# Patient Record
Sex: Male | Born: 1975 | Race: White | Hispanic: No | Marital: Married | Smoking: Former smoker
Health system: Southern US, Community
[De-identification: ages and names within clinical notes are randomized; demographics above are authoritative.]

## PROBLEM LIST (undated history)

## (undated) ENCOUNTER — Emergency Department: Admission: EM | Discharge status: Absent without leave | Payer: 59 | Source: Home / Self Care

## (undated) DIAGNOSIS — F1010 Alcohol abuse, uncomplicated: Secondary | ICD-10-CM

## (undated) DIAGNOSIS — IMO0001 Reserved for inherently not codable concepts without codable children: Secondary | ICD-10-CM

## (undated) DIAGNOSIS — I10 Essential (primary) hypertension: Secondary | ICD-10-CM

## (undated) DIAGNOSIS — R748 Abnormal levels of other serum enzymes: Secondary | ICD-10-CM

## (undated) HISTORY — DX: Reserved for inherently not codable concepts without codable children: IMO0001

## (undated) HISTORY — DX: Abnormal levels of other serum enzymes: R74.8

## (undated) HISTORY — DX: Alcohol abuse, uncomplicated: F10.10

## (undated) HISTORY — DX: Essential (primary) hypertension: I10

---

## 2009-01-05 ENCOUNTER — Ambulatory Visit: Payer: Self-pay | Admitting: Occupational Medicine

## 2009-01-09 ENCOUNTER — Ambulatory Visit: Payer: Self-pay | Admitting: Family Medicine

## 2009-01-09 DIAGNOSIS — E785 Hyperlipidemia, unspecified: Secondary | ICD-10-CM | POA: Insufficient documentation

## 2009-01-09 DIAGNOSIS — I10 Essential (primary) hypertension: Secondary | ICD-10-CM | POA: Insufficient documentation

## 2009-01-09 DIAGNOSIS — F1010 Alcohol abuse, uncomplicated: Secondary | ICD-10-CM | POA: Insufficient documentation

## 2009-04-09 ENCOUNTER — Ambulatory Visit: Payer: Self-pay | Admitting: Family Medicine

## 2009-05-06 ENCOUNTER — Encounter: Payer: Self-pay | Admitting: Family Medicine

## 2009-05-07 LAB — CONVERTED CEMR LAB
BUN: 16 mg/dL (ref 6–23)
CO2: 23 meq/L (ref 19–32)
Calcium: 10.2 mg/dL (ref 8.4–10.5)
Chloride: 106 meq/L (ref 96–112)
Creatinine, Ser: 1.04 mg/dL (ref 0.40–1.50)
Glucose, Bld: 101 mg/dL — ABNORMAL HIGH (ref 70–99)
Potassium: 4.7 meq/L (ref 3.5–5.3)
Sodium: 140 meq/L (ref 135–145)

## 2009-05-15 ENCOUNTER — Encounter: Payer: Self-pay | Admitting: Family Medicine

## 2009-05-20 ENCOUNTER — Telehealth: Payer: Self-pay | Admitting: Family Medicine

## 2009-12-25 ENCOUNTER — Ambulatory Visit: Payer: Self-pay | Admitting: Family Medicine

## 2009-12-25 DIAGNOSIS — R0789 Other chest pain: Secondary | ICD-10-CM | POA: Insufficient documentation

## 2010-01-04 ENCOUNTER — Encounter: Payer: Self-pay | Admitting: Family Medicine

## 2010-01-05 ENCOUNTER — Encounter: Payer: Self-pay | Admitting: Family Medicine

## 2010-01-05 LAB — CONVERTED CEMR LAB
ALT: 69 units/L — ABNORMAL HIGH (ref 0–53)
AST: 48 units/L — ABNORMAL HIGH (ref 0–37)
Albumin: 4.4 g/dL (ref 3.5–5.2)
Alkaline Phosphatase: 46 units/L (ref 39–117)
BUN: 17 mg/dL (ref 6–23)
Basophils Absolute: 0 10*3/uL (ref 0.0–0.1)
Basophils Relative: 1 % (ref 0–1)
CO2: 22 meq/L (ref 19–32)
Calcium: 9.3 mg/dL (ref 8.4–10.5)
Chloride: 106 meq/L (ref 96–112)
Cholesterol: 227 mg/dL — ABNORMAL HIGH (ref 0–200)
Creatinine, Ser: 1.18 mg/dL (ref 0.40–1.50)
Eosinophils Absolute: 0.2 10*3/uL (ref 0.0–0.7)
Eosinophils Relative: 5 % (ref 0–5)
Glucose, Bld: 98 mg/dL (ref 70–99)
HCT: 43.5 % (ref 39.0–52.0)
HDL: 35 mg/dL — ABNORMAL LOW (ref >39)
Hemoglobin: 14.6 g/dL (ref 13.0–17.0)
LDL Cholesterol: 114 mg/dL — ABNORMAL HIGH (ref 0–99)
Lymphocytes Relative: 29 % (ref 12–46)
Lymphs Abs: 1 10*3/uL (ref 0.7–4.0)
MCHC: 33.6 g/dL (ref 30.0–36.0)
MCV: 89.7 fL (ref 78.0–100.0)
Monocytes Absolute: 0.3 10*3/uL (ref 0.1–1.0)
Monocytes Relative: 8 % (ref 3–12)
Neutro Abs: 2.1 10*3/uL (ref 1.7–7.7)
Neutrophils Relative %: 58 % (ref 43–77)
Platelets: 163 10*3/uL (ref 150–400)
Potassium: 4.6 meq/L (ref 3.5–5.3)
RBC: 4.85 M/uL (ref 4.22–5.81)
RDW: 12.8 % (ref 11.5–15.5)
Sodium: 139 meq/L (ref 135–145)
TSH: 1.264 microintl units/mL (ref 0.350–4.500)
Total Bilirubin: 0.6 mg/dL (ref 0.3–1.2)
Total CHOL/HDL Ratio: 6.5
Total Protein: 7.1 g/dL (ref 6.0–8.3)
Triglycerides: 391 mg/dL — ABNORMAL HIGH (ref <150)
VLDL: 78 mg/dL — ABNORMAL HIGH (ref 0–40)
WBC: 3.5 10*3/uL — ABNORMAL LOW (ref 4.0–10.5)

## 2010-01-06 DIAGNOSIS — R740 Nonspecific elevation of levels of transaminase and lactic acid dehydrogenase [LDH]: Secondary | ICD-10-CM

## 2010-01-06 DIAGNOSIS — R7401 Elevation of levels of liver transaminase levels: Secondary | ICD-10-CM | POA: Insufficient documentation

## 2010-01-06 DIAGNOSIS — R7402 Elevation of levels of lactic acid dehydrogenase (LDH): Secondary | ICD-10-CM | POA: Insufficient documentation

## 2010-01-06 LAB — CONVERTED CEMR LAB
HCV Ab: NEGATIVE
Hep A IgM: NEGATIVE
Hep B C IgM: NEGATIVE
Hepatitis B Surface Ag: NEGATIVE

## 2010-01-22 ENCOUNTER — Encounter: Payer: Self-pay | Admitting: Family Medicine

## 2010-01-25 LAB — CONVERTED CEMR LAB
ALT: 90 units/L — ABNORMAL HIGH (ref 0–53)
AST: 52 units/L — ABNORMAL HIGH (ref 0–37)
Albumin: 4.9 g/dL (ref 3.5–5.2)
Alkaline Phosphatase: 54 units/L (ref 39–117)
Bilirubin, Direct: 0.1 mg/dL (ref 0.0–0.3)
Indirect Bilirubin: 0.5 mg/dL (ref 0.0–0.9)
Total Bilirubin: 0.6 mg/dL (ref 0.3–1.2)
Total Protein: 7.8 g/dL (ref 6.0–8.3)

## 2010-01-26 ENCOUNTER — Encounter: Admission: RE | Admit: 2010-01-26 | Discharge: 2010-01-26 | Payer: Self-pay | Admitting: Family Medicine

## 2010-01-26 ENCOUNTER — Encounter: Payer: Self-pay | Admitting: Family Medicine

## 2010-04-02 ENCOUNTER — Ambulatory Visit: Payer: Self-pay | Admitting: Family Medicine

## 2010-04-02 DIAGNOSIS — I10 Essential (primary) hypertension: Secondary | ICD-10-CM | POA: Insufficient documentation

## 2010-04-03 ENCOUNTER — Encounter: Payer: Self-pay | Admitting: Family Medicine

## 2010-04-03 LAB — CONVERTED CEMR LAB
ALT: 67 units/L — ABNORMAL HIGH (ref 0–53)
AST: 48 units/L — ABNORMAL HIGH (ref 0–37)
Albumin: 4.6 g/dL (ref 3.5–5.2)
Alkaline Phosphatase: 48 units/L (ref 39–117)
Bilirubin, Direct: 0.1 mg/dL (ref 0.0–0.3)
Indirect Bilirubin: 0.5 mg/dL (ref 0.0–0.9)
Total Bilirubin: 0.6 mg/dL (ref 0.3–1.2)
Total Protein: 7.3 g/dL (ref 6.0–8.3)

## 2010-04-07 ENCOUNTER — Ambulatory Visit: Payer: Self-pay | Admitting: Family Medicine

## 2010-04-07 DIAGNOSIS — R1013 Epigastric pain: Secondary | ICD-10-CM | POA: Insufficient documentation

## 2010-04-08 ENCOUNTER — Encounter (INDEPENDENT_AMBULATORY_CARE_PROVIDER_SITE_OTHER): Payer: Self-pay | Admitting: *Deleted

## 2010-04-08 LAB — CONVERTED CEMR LAB
ALT: 66 units/L — ABNORMAL HIGH (ref 0–53)
AST: 44 units/L — ABNORMAL HIGH (ref 0–37)
Albumin: 4.6 g/dL (ref 3.5–5.2)
Alkaline Phosphatase: 50 units/L (ref 39–117)
BUN: 12 mg/dL (ref 6–23)
Basophils Absolute: 0 10*3/uL (ref 0.0–0.1)
Basophils Relative: 1 % (ref 0–1)
CO2: 23 meq/L (ref 19–32)
Calcium: 9.7 mg/dL (ref 8.4–10.5)
Chloride: 104 meq/L (ref 96–112)
Creatinine, Ser: 1.13 mg/dL (ref 0.40–1.50)
Eosinophils Absolute: 0.2 10*3/uL (ref 0.0–0.7)
Eosinophils Relative: 4 % (ref 0–5)
GGT: 115 units/L — ABNORMAL HIGH (ref 7–51)
Glucose, Bld: 85 mg/dL (ref 70–99)
HCT: 42.1 % (ref 39.0–52.0)
Hemoglobin: 14.2 g/dL (ref 13.0–17.0)
Lymphocytes Relative: 25 % (ref 12–46)
Lymphs Abs: 1 10*3/uL (ref 0.7–4.0)
MCHC: 33.7 g/dL (ref 30.0–36.0)
MCV: 88.8 fL (ref 78.0–100.0)
Monocytes Absolute: 0.3 10*3/uL (ref 0.1–1.0)
Monocytes Relative: 8 % (ref 3–12)
Neutro Abs: 2.6 10*3/uL (ref 1.7–7.7)
Neutrophils Relative %: 63 % (ref 43–77)
Platelets: 181 10*3/uL (ref 150–400)
Potassium: 4.1 meq/L (ref 3.5–5.3)
RBC: 4.74 M/uL (ref 4.22–5.81)
RDW: 13 % (ref 11.5–15.5)
Sodium: 140 meq/L (ref 135–145)
Total Bilirubin: 0.6 mg/dL (ref 0.3–1.2)
Total Protein: 7.5 g/dL (ref 6.0–8.3)
WBC: 4.2 10*3/uL (ref 4.0–10.5)

## 2010-04-13 ENCOUNTER — Encounter: Payer: Self-pay | Admitting: Family Medicine

## 2010-04-15 ENCOUNTER — Encounter: Payer: Self-pay | Admitting: Family Medicine

## 2010-08-02 ENCOUNTER — Ambulatory Visit: Payer: Self-pay | Admitting: Family Medicine

## 2010-08-03 LAB — CONVERTED CEMR LAB
ALT: 117 units/L — ABNORMAL HIGH (ref 0–53)
AST: 78 units/L — ABNORMAL HIGH (ref 0–37)
Albumin: 4.8 g/dL (ref 3.5–5.2)
Alkaline Phosphatase: 48 units/L (ref 39–117)
BUN: 15 mg/dL (ref 6–23)
CO2: 25 meq/L (ref 19–32)
Calcium: 9.8 mg/dL (ref 8.4–10.5)
Chloride: 103 meq/L (ref 96–112)
Creatinine, Ser: 1.08 mg/dL (ref 0.40–1.50)
Glucose, Bld: 80 mg/dL (ref 70–99)
HCT: 42.7 % (ref 39.0–52.0)
Hemoglobin: 14.7 g/dL (ref 13.0–17.0)
MCHC: 34.4 g/dL (ref 30.0–36.0)
MCV: 86.4 fL (ref 78.0–100.0)
Platelets: 169 10*3/uL (ref 150–400)
Potassium: 4.5 meq/L (ref 3.5–5.3)
RBC: 4.94 M/uL (ref 4.22–5.81)
RDW: 12.8 % (ref 11.5–15.5)
Sodium: 140 meq/L (ref 135–145)
TSH: 1.103 microintl units/mL (ref 0.350–4.500)
Total Bilirubin: 0.7 mg/dL (ref 0.3–1.2)
Total Protein: 7.5 g/dL (ref 6.0–8.3)
WBC: 5.2 10*3/uL (ref 4.0–10.5)

## 2010-09-09 ENCOUNTER — Encounter: Payer: Self-pay | Admitting: Family Medicine

## 2010-11-12 ENCOUNTER — Ambulatory Visit
Admission: RE | Admit: 2010-11-12 | Discharge: 2010-11-12 | Payer: Self-pay | Source: Home / Self Care | Attending: Family Medicine | Admitting: Family Medicine

## 2010-11-12 ENCOUNTER — Encounter: Payer: Self-pay | Admitting: Family Medicine

## 2010-11-12 DIAGNOSIS — R51 Headache: Secondary | ICD-10-CM | POA: Insufficient documentation

## 2010-11-12 DIAGNOSIS — R519 Headache, unspecified: Secondary | ICD-10-CM | POA: Insufficient documentation

## 2010-11-15 LAB — CONVERTED CEMR LAB
ALT: 136 units/L — ABNORMAL HIGH (ref 0–53)
AST: 73 units/L — ABNORMAL HIGH (ref 0–37)
Albumin: 5.4 g/dL — ABNORMAL HIGH (ref 3.5–5.2)
Alkaline Phosphatase: 50 units/L (ref 39–117)
BUN: 15 mg/dL (ref 6–23)
Basophils Absolute: 0 10*3/uL (ref 0.0–0.1)
Basophils Relative: 1 % (ref 0–1)
CO2: 24 meq/L (ref 19–32)
Calcium: 9.9 mg/dL (ref 8.4–10.5)
Chloride: 104 meq/L (ref 96–112)
Creatinine, Ser: 1.16 mg/dL (ref 0.40–1.50)
Eosinophils Absolute: 0.1 10*3/uL (ref 0.0–0.7)
Eosinophils Relative: 2 % (ref 0–5)
Glucose, Bld: 93 mg/dL (ref 70–99)
HCT: 42.3 % (ref 39.0–52.0)
Hemoglobin: 14.8 g/dL (ref 13.0–17.0)
Lymphocytes Relative: 26 % (ref 12–46)
Lymphs Abs: 1.1 10*3/uL (ref 0.7–4.0)
MCHC: 35 g/dL (ref 30.0–36.0)
MCV: 86.9 fL (ref 78.0–100.0)
Monocytes Absolute: 0.3 10*3/uL (ref 0.1–1.0)
Monocytes Relative: 8 % (ref 3–12)
Neutro Abs: 2.6 10*3/uL (ref 1.7–7.7)
Neutrophils Relative %: 64 % (ref 43–77)
Platelets: 167 10*3/uL (ref 150–400)
Potassium: 4 meq/L (ref 3.5–5.3)
RBC: 4.87 M/uL (ref 4.22–5.81)
RDW: 12.3 % (ref 11.5–15.5)
Sodium: 140 meq/L (ref 135–145)
Total Bilirubin: 0.6 mg/dL (ref 0.3–1.2)
Total Protein: 7.8 g/dL (ref 6.0–8.3)
WBC: 4.1 10*3/uL (ref 4.0–10.5)

## 2010-11-30 NOTE — Consult Note (Signed)
Summary: [facility]  [facility]   Imported By: Lanelle Bal 09/21/2010 13:02:23  _____________________________________________________________________  External Attachment:    Type:   Image     Comment:   External Document
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
## 2010-11-30 NOTE — Assessment & Plan Note (Signed)
Summary: Fu HTN, cough on ACEi   Vital Signs:  Patient profile:   35 year old male Height:      75.25 inches Weight:      300 pounds Pulse rate:   81 / minute BP sitting:   144 / 91  (left arm) Cuff size:   large  Vitals Entered By: Kathlene November (April 09, 2009 8:51 AM) CC: recheck on BP, Hypertension Management   Primary Care Provider:  Nani Gasser MD  CC:  recheck on BP and Hypertension Management.  History of Present Illness: Has lost 14 pounds and BP looks much better. Has a dry cough.  Somtimes int he middle of the night.  Thinks it is from the medicine.  Has had it for about 3 weeks.  Had URI initally but otherwise all signs have resolved.   Hypertension History:      He denies headache, chest pain, palpitations, dyspnea with exertion, orthopnea, PND, peripheral edema, visual symptoms, neurologic problems, syncope, and side effects from treatment.  He notes the following problems with antihypertensive medication side effects: Did feel a little light headed the first couple of days but that has resolved.  Marland Kitchen        Positive major cardiovascular risk factors include hyperlipidemia, hypertension, and current tobacco user.  Negative major cardiovascular risk factors include male age less than 87 years old.     Current Medications (verified): 1)  Lisinopril-Hydrochlorothiazide 10-12.5 Mg Tabs (Lisinopril-Hydrochlorothiazide) .... Take 1 Tablet By Mouth Once A Day  Allergies (verified): 1)  Lisinopril-Hydrochlorothiazide (Lisinopril-Hydrochlorothiazide)  Comments:  Nurse/Medical Assistant: The patient's medications and allergies were reviewed with the patient and were updated in the Medication and Allergy Lists. Kathlene November (April 09, 2009 8:52 AM)  Physical Exam  General:  Well-developed,well-nourished,in no acute distress; alert,appropriate and cooperative throughout examination Head:  Normocephalic and atraumatic without obvious abnormalities. No apparent alopecia or  balding. Lungs:  Normal respiratory effort, chest expands symmetrically. Lungs are clear to auscultation, no crackles or wheezes. Heart:  Normal rate and regular rhythm. S1 and S2 normal without gallop, murmur, click, rub or other extra sounds. Skin:  no rashes.   Psych:  Cognition and judgment appear intact. Alert and cooperative with normal attention span and concentration. No apparent delusions, illusions, hallucinations   Impression & Recommendations:  Problem # 1:  ESSENTIAL HYPERTENSION, BENIGN (ICD-401.1) IMproved but having cough on an ACE. Will change to ARB. It appears Benicar is preferred on his plan. New rx given and coupon card given.  His updated medication list for this problem includes:    Lisinopril-hydrochlorothiazide 10-12.5 Mg Tabs (Lisinopril-hydrochlorothiazide) .Marland Kitchen... Take 1 tablet by mouth once a day    Benicar Hct 40-12.5 Mg Tabs (Olmesartan medoxomil-hctz) .Marland Kitchen... Take 1 tablet by mouth once a day  Orders: T-Basic Metabolic Panel [id number])  BP today: 144/91 Prior BP: 154/95 (01/09/2009)  Complete Medication List: 1)  Lisinopril-hydrochlorothiazide 10-12.5 Mg Tabs (Lisinopril-hydrochlorothiazide) .... Take 1 tablet by mouth once a day 2)  Benicar Hct 40-12.5 Mg Tabs (Olmesartan medoxomil-hctz) .... Take 1 tablet by mouth once a day  Hypertension Assessment/Plan:      The patient's hypertensive risk group is category B: At least one risk factor (excluding diabetes) with no target organ damage.  Today's blood pressure is 144/91.  His blood pressure goal is < 140/90. Prescriptions: BENICAR HCT 40-12.5 MG TABS (OLMESARTAN MEDOXOMIL-HCTZ) Take 1 tablet by mouth once a day  #30 x 2   Entered and Authorized by:   Nani Gasser MD  Signed by:   Nani Gasser MD on 04/09/2009   Method used:   Electronically to        [facility] [phone]* (retail)       [address] [place], [place]  [address]       Ph: [phone] or [phone]       Fax:  [phone]   RxID:   [id number]

## 2010-11-30 NOTE — Letter (Signed)
Summary: Primary Care Consult Scheduled Letter  [facility]  [address], [address]   [place], [place] [address]   Phone: [phone]  Fax: [phone]      04/08/2010 MRN: [id number]  RANSOME HELWIG [address] [place], [place]  [address]    Dear Mr. Paulla Dolly,    We have scheduled an appointment for you.  At the recommendation of Dr.Bowen, we have scheduled you a consult with [facility] [place]- Dr.Katopes on 04/19/10 at 3:15.  Their address is [address] [place], [address]. The office phone number is [phone].  If this appointment day and time is not convenient for you, please feel free to call the office of the doctor you are being referred to at the number listed above and reschedule the appointment.     It is important for you to keep your scheduled appointments. We are here to make sure you are given good patient care.     Thank you, Michaelle Copas Patient Care Coordinator [facility] [place] [phone]

## 2010-11-30 NOTE — Assessment & Plan Note (Signed)
Summary: epigastric pain   Vital Signs:  Patient profile:   35 year old male Height:      75.25 inches Weight:      308 pounds BMI:     38.38 O2 Sat:      98 % on Room air Temp:     98.6 degrees F oral Pulse rate:   70 / minute BP sitting:   122 / 77  (left arm) Cuff size:   large  Vitals Entered By: Payton Spark CMA (April 07, 2010 1:55 PM)  O2 Flow:  Room air CC: Upper GI discomfort x 2 weeks.    Primary Care Provider:  Nani Gasser MD  CC:  Upper GI discomfort x 2 weeks. Marland Kitchen  History of Present Illness: 36 yo WM presents for epigastric pain x 2 wks.  He went to [facility] and was given Prilosec which helped a little bit.  He has had some lightheadedness and presyncope.  Denies chest pain, cough, SOB or heartburn.  No blood in the stools.  No melena.  No constipation or diarrhea.    This all started 2 wks ago.  He had CP 2 mos ago and had a normal EKG.  This pain comes and goes.  It doesn't seem to be related to food.  Has a good appetite.  He is not taking any ASA or NSAIDs.  He does drink 3 + liquor drinks/ day.  This does not seem to cause pain.    Current Medications (verified): 1)  Hyzaar 100-12.5 Mg Tabs (Losartan Potassium-Hctz) .... Take 1 Tablet By Mouth Once A Day 2)  Omeprazole 40 Mg Cpdr (Omeprazole) .... One By Mouth Qam 30 Min Ac  Allergies (verified): 1)  Lisinopril-Hydrochlorothiazide (Lisinopril-Hydrochlorothiazide)  Past History:  Past Medical History: Hypertension elevated liver enzymes heavy ETOH use  Social History: Reviewed history from 01/09/2009 and no changes required. 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   Eyes:  sclera non icteric Mouth:  good dentition and pharynx pink and moist.   Neck:  no masses.   Lungs:  Normal respiratory effort, chest expands symmetrically.  Lungs are clear to auscultation, no crackles or wheezes. Heart:  Normal rate and regular rhythm. S1 and S2 normal without gallop, murmur, click, rub or other extra sounds. Abdomen:  soft, non-tender, normal bowel sounds, no distention, no masses, no guarding, no hepatomegaly, and no splenomegaly.   Extremities:  no LE edema Skin:  color normal.  no jaundice or pallor Psych:  good eye contact, not anxious appearing, and not depressed appearing.     Impression & Recommendations:  Problem # 1:  EPIGASTRIC PAIN (ICD-789.06) Likely to be reflux esophagitis with a hiatal hernia.  Will get a CBC today given symptoms of lightheadedness and hx of heavy ETOH use to be sure he does not have GI bleeding/ ulcers.    Will change Omeprazole to Dexilant, samples given.  REflux precautions given.  Avoid ASA or NSAIDs.  Avoid ETOH and smoking.  Refer to GI for further eval.   Orders: T-Comprehensive Metabolic Panel ([id number]) T-CBC w/Diff ([id number]) Gastroenterology Referral (GI)  Complete Medication List: 1)  Hyzaar 100-12.5 Mg Tabs (Losartan potassium-hctz) .... Take 1 tablet by mouth once a day 2)  Dexilant 60 Mg Cpdr (Dexlansoprazole) .Marland KitchenMarland KitchenMarland Kitchen 1  capsule by mouth daily  Other Orders: T-Gamma GT (GGT) ([id number])  Patient Instructions: 1)  Check labs downstairs today. 2)  Will call you w/ results tomorrow. 3)  Change Omeprazole to Dexilant, 1 capsule by mouth daily. 4)  Adhere to reflux precautions. 5)  Avoid smoking and cut back on alcohol. 6)  Avoid any aspirin or ibuprofen products. 7)  Will set you up to see GI to look for hiatal hernia, reflux esophagitis or gastritis.

## 2010-11-30 NOTE — Letter (Signed)
Summary: Letter to Patient with Lab Results/[facility]   Letter to Patient with Lab Results/[facility]   Imported By: Lanelle Bal 04/22/2010 14:25:23  _____________________________________________________________________  External Attachment:    Type:   Image     Comment:   External Document

## 2010-11-30 NOTE — Letter (Signed)
Summary: Internal Correspondence-RELEASE FORM  Internal Correspondence-RELEASE FORM   Imported By: Vanessa Swaziland 01/26/2010 14:45:16  _____________________________________________________________________  External Attachment:    Type:   Image     Comment:   INTERNAL Document

## 2010-11-30 NOTE — Assessment & Plan Note (Signed)
Summary: NEW PT: HTN, cholesterol, etc   Vital Signs:  Patient profile:   35 year old male Height:      75.25 inches Weight:      314 pounds BMI:     39.13 O2 Sat:      98 % Pulse rate:   69 / minute BP sitting:   154 / 95  (left arm) Cuff size:   large  Vitals Entered By: Harlene Salts (January 09, 2009 9:52 AM) Is Patient Diabetic? No Pain Assessment Patient in pain? no        History of Present Illness: NOV,ELEVATED BP.  Dx in Urgent Care.  Mom with HTN.  Notices frequent sweating. No HAs, lightheadedness or dizziness.  Has gained about 50 lbs in the lst 10 years.  Drinking to much.  Drinks about 3-4 liquid a day.   Enjoys alchohol.  Smokes a couple of cig a week.    Habits & Providers     Alcohol drinks/day: 2     Alcohol Counseling: to decrease amount and/or frequency of alcohol intake     Alcohol type: liquor     >5/day in last 3 mos: yes     Feels need to cut down: yes     Feels guilty re: drinking: yes     Tobacco Status: current     Tobacco Counseling: to quit use of tobacco products     Cigarette Packs/Day: <0.25     Year Started: 1994     Diet Comments: fair     Does Patient Exercise: no     Drug Use: current     Seat Belt Use: always  Current Medications (verified): 1)  Ibu 800 Mg Tabs (Ibuprofen) .... 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  No vison changes.  No difficulty hearing/ringing in ears, hay fever/allergies.  No chest pain/discomfort, palpitations.  No Br lump/nipple discharge.  No cough/wheeze.  No blood in BM, nausea/vomiting/diarrhea.  No nighttime urination, leaking urine, unusual vaginal bleeding, discharge (penis or vagina).  No muscle/joint pain. No rash, change in mole.  No HA, memory loss.  No anxiety, sleep d/o, depression.  No easy bruising/bleeding, unexplained lump   Physical Exam  General:  Well-developed,well-nourished,in no acute distress; alert,appropriate and cooperative throughout examination Head:  Normocephalic and atraumatic without obvious abnormalities. No apparent alopecia or balding. Lungs:  Normal respiratory effort, chest expands symmetrically. Lungs are clear to auscultation, no crackles or wheezes. Heart:  Normal rate and regular rhythm. S1 and S2 normal without gallop, murmur, click, rub or other extra sounds. Pulses:  Raidla  2+  Skin:  no rashes.   Psych:  Cognition and judgment appear intact. Alert and cooperative with normal attention span and concentration. No apparent delusions, illusions, hallucinations   Impression & Recommendations:  Problem # 1:  ESSENTIAL HYPERTENSION, BENIGN (ICD-401.1) Assessment New Warned of ptential se. Discusse dthe DASH diet. FU in 3 week.  His updated medication list for this problem includes:    Lisinopril-hydrochlorothiazide 10-12.5 Mg Tabs (Lisinopril-hydrochlorothiazide) .Marland Kitchen... Take 1 tablet by mouth once a day  BP today: 154/95 Prior BP: 161/104 (01/05/2009)  Problem # 2:  HYPERLIPIDEMIA (ICD-272.4) Pt will bring in copy from [facility]. He says his numbers were high.   Problem # 3:  ALCOHOL ABUSE (ICD-305.00) Pt wants to discuss further at the next visit.   Complete Medication List: 1)  Ibu 800 Mg Tabs (Ibuprofen) .... One tab three times a day with food 2)  Lisinopril-hydrochlorothiazide  10-12.5 Mg Tabs (Lisinopril-hydrochlorothiazide) .... Take 1 tablet by mouth once a day  Patient Instructions: 1)  DASH diet ( .nih/gov) 2)  Please schedule a follow-up appointment in 3-4 weeks for fu the blood pressure.  Prescriptions: LISINOPRIL-HYDROCHLOROTHIAZIDE 10-12.5 MG TABS (LISINOPRIL-HYDROCHLOROTHIAZIDE) Take 1 tablet by mouth once a day  #30 x 1   Entered and Authorized by:   Nani Gasser MD   Signed by:   Nani Gasser MD on 01/09/2009   Method used:   [facility] [phone][facility] (retail)       [address]       [place], [place]  [address]       Ph: [phone] or [phone]       Fax: [phone]   RxID:   [id number]

## 2010-11-30 NOTE — Assessment & Plan Note (Signed)
Summary: UNCOMFORTABLE IN UPPER STOMACH   Vital Signs:  Patient Profile:   35 Years Old Male CC:      Epigastric discomfort x 2 weeks Height:     75.25 inches Weight:      295 pounds O2 Sat:      98 % O2 treatment:    Room Air Temp:     99.2 degrees F oral Pulse rate:   66 / minute Pulse rhythm:   regular Resp:     16 per minute BP sitting:   135 / 87  (right arm) Cuff size:   large  Vitals Entered By: Emilio Math (April 02, 2010 1:54 PM)                  Current Allergies (reviewed today): LISINOPRIL-HYDROCHLOROTHIAZIDE (LISINOPRIL-HYDROCHLOROTHIAZIDE)History of Present Illness Chief Complaint: Epigastric discomfort x 2 weeks History of Present Illness: Subjective:  Patient complains of 2 week history of vague intermittent painless epigastric "fluttering" sensation that may last 10 seconds to a minute, and occurs several times daily.  He points to his sub-xiphoid area.  The sensation does not seem to be related to eating, and tends to occur in the morning.  No chest pain or shortness of breath.  No cough.  He states that EKG and GB U/S were done one month ago and were negative.  His LFT's were mildly elevated.  No fevers, chills, and sweats.  No nausea/vomiting.  BM's have had no changes.  He states that he has had indigestion in the past but not recently.  Current Meds HYZAAR 100-12.5 MG TABS (LOSARTAN POTASSIUM-HCTZ) Take 1 tablet by mouth once a day OMEPRAZOLE 40 MG CPDR (OMEPRAZOLE) One by mouth Qam 30 min AC  REVIEW OF SYSTEMS Constitutional Symptoms      Denies fever, chills, night sweats, weight loss, weight gain, and fatigue.  Eyes       Denies change in vision, eye pain, eye discharge, glasses, contact lenses, and eye surgery. Ear/Nose/Throat/Mouth       Denies hearing loss/aids, change in hearing, ear pain, ear discharge, dizziness, frequent runny nose, frequent nose bleeds, sinus problems, sore throat, hoarseness, and tooth pain or bleeding.  Respiratory  Denies dry cough, productive cough, wheezing, shortness of breath, asthma, bronchitis, and emphysema/COPD.  Cardiovascular       Denies murmurs, chest pain, and tires easily with exhertion.    Gastrointestinal       Complains of stomach pain.      Denies nausea/vomiting, diarrhea, constipation, blood in bowel movements, and indigestion. Genitourniary       Denies painful urination, kidney stones, and loss of urinary control. Neurological       Denies paralysis, seizures, and fainting/blackouts. Musculoskeletal       Denies muscle pain, joint pain, joint stiffness, decreased range of motion, redness, swelling, muscle weakness, and gout.  Skin       Denies bruising, unusual mles/lumps or sores, and hair/skin or nail changes.  Psych       Denies mood changes, temper/anger issues, anxiety/stress, speech problems, depression, and sleep problems.  Past History:  Past Medical History: Hypertension  Past Surgical History: Reviewed history from 01/05/2009 and no changes required. Denies surgical history  Family History: Reviewed history from 01/09/2009 and no changes required. Mother, HTN, hi cholesterol Father, Healthy Sister, Healthy  Social History: Reviewed history from 01/09/2009 and no changes required. 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  Extraocular movement is intact.  Conjunctivae are not inflamed.  Pharynx:  Normal  Neck:  Supple.  No adenopathy is present.  No thyromegaly is present  Lungs:  Clear to auscultation.  Breath sounds are equal.  Heart:  Regular rate and rhythm without murmurs, rubs, or gallops.  Abdomen:  Nontender without masses or hepatosplenomegaly.  Bowel sounds are present.  No CVA or flank tenderness.  There is no tenderness palpated in the sub-xiphoid  area. Extremities:  No edema.   Assessment  Assessed CHEST PAIN, ATYPICAL as deteriorated - Donna Christen MD New Problems: HYPERTENSION (ICD-401.9)  ? GERD  Plan New Medications/Changes: OMEPRAZOLE 40 MG CPDR (OMEPRAZOLE) One by mouth Qam 30 min AC  #15 x 1, 04/02/2010, Donna Christen MD  New Orders: T-Hepatic Function [id number] Est. Patient Level III [08657] Planning Comments:   Trial of omeprazole daily; if symptoms improve, continue for one month.   Repeat LFT's Follow-up with PCP   The patient and/or caregiver has been counseled thoroughly with regard to medications prescribed including dosage, schedule, interactions, rationale for use, and possible side effects and they verbalize understanding.  Diagnoses and expected course of recovery discussed and will return if not improved as expected or if the condition worsens. Patient and/or caregiver verbalized understanding.  Prescriptions: OMEPRAZOLE 40 MG CPDR (OMEPRAZOLE) One by mouth Qam 30 min AC  #15 x 1   Entered and Authorized by:   Donna Christen MD   Signed by:   Donna Christen MD on 04/02/2010   Method used:   Print then Give to Patient   RxID:   [id number]   Orders Added: 1)  T-Hepatic Function [[id number]] 2)  Est. Patient Level III [44034]

## 2010-11-30 NOTE — Consult Note (Signed)
Summary: [facility]  [facility]   Imported By: Lanelle Bal 04/20/2010 12:24:12  _____________________________________________________________________  External Attachment:    Type:   Image     Comment:   External Document

## 2010-11-30 NOTE — Assessment & Plan Note (Signed)
Summary: BACK PAIN/KH   Vital Signs:  Patient Profile:   35 Years Old Male CC:      Lower back pain x 3 days moving some large blocks on Thursday, pain started on Saturday Height:     75.5 inches Weight:      311 pounds O2 Sat:      97 % O2 treatment:    Room Air Temp:     98.3 degrees F oral Pulse rate:   76 / minute Pulse rhythm:   regular Resp:     16 per minute BP sitting:   161 / 104  (right arm) Cuff size:   large  Pt. in pain?   yes    Location:   lower back    Intensity:   6    Type:       sharp  Vitals Entered By: Emilio Math (January 05, 2009 2:42 PM)                   Current Allergies: No known allergies  History of Present Illness Chief Complaint: Lower back pain x 3 days moving some large blocks on Thursday, pain started on Saturday History of Present Illness: 35 yo male with low back pain since Saturday.  Pt states he hurt his lower back while moving some large retaining blocks last Thursday and pain statred worsening Saturday morning.  Pt states it happened before but this is worse.  Pt has an appt with the chiropracter later this afternoon.  Pt denies bowel or bladder changes or problems.  Denies loss of sensation in feet or legs.  Pt states the pain does not radiate but is localized in one area.  Pt does not have a pcm.  No other complaints at this time.     All:  nkda   REVIEW OF SYSTEMS Constitutional Symptoms      Denies fever, chills, night sweats, weight loss, weight gain, and fatigue.  Eyes       Denies change in vision, eye pain, eye discharge, glasses, contact lenses, and eye surgery. Ear/Nose/Throat/Mouth       Denies hearing loss/aids, change in hearing, ear pain, ear discharge, dizziness, frequent runny nose, frequent nose bleeds, sinus problems, sore throat, hoarseness, and tooth pain or bleeding.  Respiratory       Denies dry cough, productive cough, wheezing, shortness of breath, asthma, bronchitis, and emphysema/COPD.   Cardiovascular       Denies murmurs, chest pain, and tires easily with exhertion.    Gastrointestinal       Denies stomach pain, nausea/vomiting, diarrhea, constipation, blood in bowel movements, and indigestion. Genitourniary       Denies painful urination, kidney stones, and loss of urinary control. Neurological       Denies paralysis, seizures, and fainting/blackouts. Musculoskeletal       Complains of muscle pain and joint pain.      Denies joint stiffness, decreased range of motion, redness, swelling, muscle weakness, and gout.  Skin       Denies bruising, unusual mles/lumps or sores, and hair/skin or nail changes.  Psych       Denies mood changes, temper/anger issues, anxiety/stress, speech problems, depression, and sleep problems.  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 General appearance: well developed, well nourished, no acute distress Head: normocephalic, atraumatic Chest/Lungs: no rales, wheezes, or rhonchi bilateral, breath sounds equal without effort Heart: regular rate and  rhythm, no murmur Extremities: normal extremities Neurological: grossly intact and non-focal Back: tender musculature bilateral lower back, straight leg raises negative bilaterally, deep tendon reflexes 2+ at achilles and patella MSE: oriented to time, place, and person    Assessment New Problems: BACK PAIN (ICD-724.5)   Plan New Medications/Changes: IBU 800 MG TABS (IBUPROFEN) one tab three times a day with food  #30 x 0, 01/05/2009, Jodie Echevaria DO  New Orders: New Patient Level III [99203] Ketorolac-Toradol 15mg  [J1885] Admin of Therapeutic Inj  intramuscular or subcutaneous [96372] Planning Comments:   1.  Rest, apply ice to  area for 20 minutes each hour for 1-2 days as tolerable.  Motrin 800mg  three times a day with food may be used to control pain and swelling. 2. If you are not getting better in 7-10 days please follow up here or with your primary care Reni Hausner.  If you are getting worse and need to be seen sooner, please, return to the clinic. 3. Toradol 60 mg im x1 now. 4. Pt given Dr. Shelah Lewandowsky card for follow up examination.   The patient and/or caregiver has been counseled thoroughly with regard to medications prescribed including dosage, schedule, interactions, rationale for use, and possible side effects and they verbalize understanding.  Diagnoses and expected course of recovery discussed and will return if not improved as expected or if the condition worsens. Patient and/or caregiver verbalized understanding.    Prescriptions: IBU 800 MG TABS (IBUPROFEN) one tab three times a day with food  #30 x 0   Entered and Authorized by:   Jodie Echevaria DO   Signed by:   Jodie Echevaria DO on 01/05/2009   Method used:   Electronically to        [facility] [phone]* (retail)       [address]       [place], [place]  [address]       Ph: [phone] or [phone]       Fax: [phone]   RxID:   [email or web address]   ] ]  Medication Administration  Injection # 1:    Medication: Ketorolac-Toradol 15mg     Diagnosis: BACK PAIN (ICD-724.5)    Route: IM    Site: RUOQ gluteus    Exp Date: 07/31/2010    Lot #: [id number]    Mfr: APP    Comments: Administered 60 mg    Patient tolerated injection without complications    Given by: Areta Haber CMA (January 05, 2009 3:23 PM)  Orders Added: 1)  New Patient Level III [99203] 2)  Ketorolac-Toradol 15mg  [J1885] 3)  Admin of Therapeutic Inj  intramuscular or subcutaneous [43329]

## 2010-11-30 NOTE — Progress Notes (Signed)
Summary: Change Benicar HCT to Losartan HCT  Phone Note Outgoing Call   Summary of Call: Note from insurance company .  Benicar was denies. Needs to be changed.  Attempted to contact pt multiple time.will change to losartan HCT.  Initial call taken by: Nani Gasser MD,  May 20, 2009 11:36 AM    New/Updated Medications: HYZAAR 100-12.5 MG TABS (LOSARTAN POTASSIUM-HCTZ) Take 1 tablet by mouth once a day Prescriptions: HYZAAR 100-12.5 MG TABS (LOSARTAN POTASSIUM-HCTZ) Take 1 tablet by mouth once a day  #30 x 2   Entered and Authorized by:   Nani Gasser MD   Signed by:   Nani Gasser MD on 05/20/2009   Method used:   Electronically to        [facility] [phone]* (retail)       [address]       [place], [place]  [address]       Ph: [phone] or [phone]       Fax: [phone]   RxID:   [id number]   Appended Document: Change Benicar HCT to Losartan HCT Pt aware

## 2010-11-30 NOTE — Assessment & Plan Note (Signed)
Summary: Atypical CP   Vital Signs:  Patient profile:   35 year old male Height:      75.25 inches Weight:      309 pounds BMI:     38.50 O2 Sat:      98 % on Room air Pulse rate:   64 / minute BP sitting:   133 / 83  (left arm) Cuff size:   large  Vitals Entered By: Kathlene November (December 25, 2009 10:04 AM)  O2 Flow:  Room air CC: chest pain on and off for 3 weeks now- denies H/A, SOB, sweats, back pain, arm pain or dizziness   Primary Care Provider:  Nani Gasser MD  CC:  chest pain on and off for 3 weeks now- denies H/A, SOB, sweats, back pain, and arm pain or dizziness.  History of Present Illness: chest pain on and off for 3 weeks now- denies H/A, SOB, sweats, back pain, arm pain or dizziness. Pain in the center of the chest. Usually lasts about 30 seconds.  Happens almost daily.  Happening more frequently.  No pain today. No heartburn or reflux signs.  No triggers.  No worsenig or alleviating sxs. Pain is a 3-4.  No fever or cough, or URI signs. No swelling in extremities.   Current Medications (verified): 1)  Hyzaar 100-12.5 Mg Tabs (Losartan Potassium-Hctz) .... Take 1 Tablet By Mouth Once A Day  Allergies (verified): 1)  Lisinopril-Hydrochlorothiazide (Lisinopril-Hydrochlorothiazide)  Comments:  Nurse/Medical Assistant: The patient's medications and allergies were reviewed with the patient and were updated in the Medication and Allergy Lists. 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 No apparent alopecia or balding. Eyes:  No corneal or conjunctival inflammation noted. EOMI.  Perrla. Ears:  External ear exam shows no significant lesions or deformities.  Neck:  No deformities, masses, or tenderness noted. No TM.  Chest Wall:  No deformities, masses, tenderness or gynecomastia noted. Lungs:  Normal respiratory effort, chest expands symmetrically. Lungs are clear to auscultation, no crackles or wheezes. Heart:  Normal rate and regular rhythm. S1 and S2 normal without gallop, murmur, click, rub or other extra sounds. No carotid bruits.  Pulses:  Radial 2+  Extremities:  No extremity edema.  Neurologic:  alert & oriented X3.   Skin:  no rashes.   Cervical Nodes:  No lymphadenopathy noted Psych:  Cognition and judgment appear intact. Alert and cooperative with normal attention span and concentration. No apparent delusions, illusions, hallucinations   Impression & Recommendations:  Problem # 1:  CHEST PAIN, ATYPICAL (ICD-786.59)  Has gained about 9 lbs since last visit.   unlikely to be cardiac based on his history. EKG shows NSR with a rate of 54 bpm, no acute changes. Asked him to measure his pulse when this happens to see if droping below 55.   No cough or fever to indicate infection.  Will get labs to rule out thyroid d/o, electrolyte distrubarnce, and anemia.  Continue current BP med. His BP is impvored today.  Orders: T-Comprehensive Metabolic Panel [id number]) T-Lipid Profile [id number]) T-TSH [id number]) T-CBC w/Diff [id number]) EKG w/ Interpretation (93000)  Complete Medication List: 1)  Hyzaar 100-12.5 Mg Tabs (Losartan potassium-hctz) .Marland KitchenMarland KitchenMarland Kitchen  Take 1 tablet by mouth once a day  Patient Instructions: 1)  Try to check pulse when this happens and let me know if Heart rate is higher than 100 or less than 55.

## 2010-11-30 NOTE — Medication Information (Signed)
Summary: [facility]ization Request [facility]  [facility]   Imported By: Maryln Gottron 06/02/2009 08:06:12  _____________________________________________________________________  External Attachment:    Type:   Image     Comment:   External Document

## 2010-12-02 NOTE — Assessment & Plan Note (Signed)
Summary: H/A's , f/u liver   Vital Signs:  Patient profile:   35 year old male Height:      75.25 inches Weight:      310 pounds Pulse rate:   81 / minute BP sitting:   128 / 75  (right arm) Cuff size:   large  Vitals Entered By: Avon Gully CMA, (AAMA) (November 12, 2010 2:01 PM) CC: HA x 2 weeks, pressure behind the eyes   Primary Care Provider:  Nani Gasser MD  CC:  HA x 2 weeks and pressure behind the eyes.  History of Present Illness: HA x 2 weeks, pressure behind the eyes. Occ will migrate to the top of the head. Says feels foggy but not lightheaded. Pressure, not throbbing.Doesn't notice it at night or when wakes up.  HAd vision changes so went to eye doc and told vision was normal. No sinus tenderness.  No nasal congestion or fever. Still drinks a fair amt of alcohol. Has abstained this week  but has been very difficutl. No other neurologic sxs.    Current Medications (verified): 1)  Hyzaar 100-12.5 Mg Tabs (Losartan Potassium-Hctz) .... Take 1 Tablet By Mouth Once A Day 2)  Dexilant 60 Mg Cpdr (Dexlansoprazole) .Marland Kitchen.. 1 Capsule By Mouth Daily  Allergies (verified): 1)  Lisinopril-Hydrochlorothiazide (Lisinopril-Hydrochlorothiazide)  Comments:  Nurse/Medical Assistant: The patient's medications and allergies were reviewed with the patient and were updated in the Medication and Allergy Lists. Avon Gully CMA, Duncan Dull) (November 12, 2010 2:04 PM)  Physical Exam  General:  Well-developed,well-nourished,in no acute distress; alert,appropriate and cooperative throughout examination Head:  Normocephalic and atraumatic without obvious abnormalities. No apparent alopecia or balding. Eyes:  No corneal or conjunctival inflammation noted. EOMI. Perrla.  Ears:  External ear exam shows no significant lesions or deformities.  Otoscopic examination reveals clear canals, tympanic membranes are intact bilaterally without bulging, retraction, inflammation or discharge.  Hearing is grossly normal bilaterally. Nose:  External nasal examination shows no deformity or inflammation.  Mouth:  Oral mucosa and oropharynx without lesions or exudates.  Teeth in good repair. Neck:  No deformities, masses, or tenderness noted. Lungs:  Normal respiratory effort, chest expands symmetrically. Lungs are clear to auscultation, no crackles or wheezes. Heart:  Normal rate and regular rhythm. S1 and S2 normal without gallop, murmur, click, rub or other extra sounds. Pulses:  Radial 2+  Neurologic:  alert & oriented X3, cranial nerves II-XII intact, gait normal, DTRs symmetrical and normal, finger-to-nose normal, heel-to-shin normal, and Romberg negative.  Alternating rapid movements are normal.  Skin:  no rashes.   Cervical Nodes:  No lymphadenopathy noted Psych:  Cognition and judgment appear intact. Alert and cooperative with normal attention span and concentration. No apparent delusions, illusions, hallucinations   Impression & Recommendations:  Problem # 1:  TRANSAMINASES, SERUM, ELEVATED (ICD-790.4) Clearly he is strugging with his alcoholisma dn this is affecting his liver. I asked if he needed help and he is not ready to admit he needs help. Will recheck his enzymes today.   Orders: T-Comprehensive Metabolic Panel [id number]) T-CBC w/Diff ([id number])  Problem # 2:  HEADACHE (ICD-784.0) Assessment: New Unclear etiology. No x of migraines. No signof sinusitis. He really hasn't taken anything for pain relief so after his labs today trial of Alevel or 600mg  IBU for pain relief.Will check CBC as well to rule out infection.  Nuero exam is normal so no likely to be neurologic and has had normalvision testing.  Orders: T-CBC w/Diff ([id number])  Complete Medication  List: 1)  Hyzaar 100-12.5 Mg Tabs (Losartan potassium-hctz) .... Take 1 tablet by mouth once a day 2)  Dexilant 60 Mg Cpdr (Dexlansoprazole) .Marland Kitchen.. 1 capsule by mouth daily   Orders Added: 1)   T-Comprehensive Metabolic Panel [[id number]] 2)  T-CBC w/Diff [[id number]] 3)  Est. Patient Level IV [62130]

## 2010-12-21 ENCOUNTER — Encounter: Payer: Self-pay | Admitting: Family Medicine

## 2010-12-22 ENCOUNTER — Encounter: Payer: Self-pay | Admitting: Family Medicine

## 2011-01-03 ENCOUNTER — Encounter: Payer: Self-pay | Admitting: Family Medicine

## 2011-01-18 NOTE — Consult Note (Signed)
Summary: [facility]  [facility]   Imported By: Maryln Gottron 01/12/2011 09:59:49  _____________________________________________________________________  External Attachment:    Type:   Image     Comment:   External Document

## 2011-01-18 NOTE — Letter (Signed)
Summary: Blood Work Psychologist, forensic  Blood Work Results Electronics engineer   Imported By: Maryln Gottron 01/12/2011 09:57:56  _____________________________________________________________________  External Attachment:    Type:   Image     Comment:   External Document

## 2011-01-18 NOTE — Op Note (Signed)
Summary: Liver Biopsy/[facility]  Liver Biopsy/[facility]   Imported By: Maryln Gottron 01/10/2011 15:53:11  _____________________________________________________________________  External Attachment:    Type:   Image     Comment:   External Document

## 2011-04-29 ENCOUNTER — Encounter: Payer: Self-pay | Admitting: Family Medicine

## 2011-05-03 ENCOUNTER — Ambulatory Visit (INDEPENDENT_AMBULATORY_CARE_PROVIDER_SITE_OTHER): Payer: BC Managed Care – PPO | Admitting: Family Medicine

## 2011-05-03 ENCOUNTER — Encounter: Payer: Self-pay | Admitting: Family Medicine

## 2011-05-03 DIAGNOSIS — R1013 Epigastric pain: Secondary | ICD-10-CM

## 2011-05-03 DIAGNOSIS — K7689 Other specified diseases of liver: Secondary | ICD-10-CM

## 2011-05-03 DIAGNOSIS — K760 Fatty (change of) liver, not elsewhere classified: Secondary | ICD-10-CM

## 2011-05-03 DIAGNOSIS — K7010 Alcoholic hepatitis without ascites: Secondary | ICD-10-CM | POA: Insufficient documentation

## 2011-05-03 MED ORDER — LOSARTAN POTASSIUM-HCTZ 100-25 MG PO TABS: 1.0000 | ORAL_TABLET | Freq: Every day | ORAL | Status: DC

## 2011-05-03 NOTE — Progress Notes (Signed)
  Subjective:    Patient ID: Marcus Torres, male    DOB: 07-23-1976, 35 y.o.   MRN: [id number]  HPI Epigastric discomfort. Feels like something moving inside.  No pain. No timing. Usually starts in the morning.  No recent reflux. Stil on dexilant.  When miseed 2-3 days in a row. . Still occ CP and heart palpitations. Occ feels dizzy.   Pain is not positional. No change in bowels.Saw Cards back about 10 months ago.  Had a normal EKG.   They recommended a stress test to  Be scheduled. No cough or SOB or recent URI. No fever.   Review of Systems     Objective:   Physical Exam  Constitutional: He is oriented to person, place, and time. He appears well-developed and well-nourished.  HENT:  Head: Normocephalic and atraumatic.  Right Ear: External ear normal.  Left Ear: External ear normal.  Eyes: Conjunctivae and EOM are normal. Pupils are equal, round, and reactive to light.  Cardiovascular: Normal rate, regular rhythm and normal heart sounds.   Pulmonary/Chest: Effort normal and breath sounds normal.  Abdominal: Soft. Bowel sounds are normal. He exhibits no distension and no mass. There is tenderness. There is no rebound and no guarding.       Epigastric tenderness.   Neurological: He is alert and oriented to person, place, and time.  Skin: Skin is warm and dry.  Psychiatric: He has a normal mood and affect. His behavior is normal.          Assessment & Plan:  EpigastricPain - Discussed may be GERD but he is already on Dexilant.  Also will get labs r/o liver adn pancreatic abnormalities. Though will likely be normal. Fatty liver doesn't usually cause pain. NOt sure if related to his atypical chest pain or not. I do recommend he get scheduled for his stress test to rule out any abnormalities.

## 2011-05-04 ENCOUNTER — Telehealth: Payer: Self-pay | Admitting: Family Medicine

## 2011-05-04 LAB — CBC WITH DIFFERENTIAL/PLATELET
Basophils Absolute: 0 10*3/uL (ref 0.0–0.1)
Basophils Relative: 0 % (ref 0–1)
Eosinophils Absolute: 0.2 10*3/uL (ref 0.0–0.7)
Eosinophils Relative: 4 % (ref 0–5)
HCT: 40.8 % (ref 39.0–52.0)
Hemoglobin: 14.2 g/dL (ref 13.0–17.0)
Lymphocytes Relative: 25 % (ref 12–46)
Lymphs Abs: 1.2 10*3/uL (ref 0.7–4.0)
MCH: 30.1 pg (ref 26.0–34.0)
MCHC: 34.8 g/dL (ref 30.0–36.0)
MCV: 86.4 fL (ref 78.0–100.0)
Monocytes Absolute: 0.3 10*3/uL (ref 0.1–1.0)
Monocytes Relative: 7 % (ref 3–12)
Neutro Abs: 2.9 10*3/uL (ref 1.7–7.7)
Neutrophils Relative %: 64 % (ref 43–77)
Platelets: 188 10*3/uL (ref 150–400)
RBC: 4.72 MIL/uL (ref 4.22–5.81)
RDW: 12.9 % (ref 11.5–15.5)
WBC: 4.5 10*3/uL (ref 4.0–10.5)

## 2011-05-04 LAB — AMYLASE: Amylase: 45 U/L (ref 0–105)

## 2011-05-04 LAB — COMPLETE METABOLIC PANEL WITH GFR
ALT: 84 U/L — ABNORMAL HIGH (ref 0–53)
AST: 59 U/L — ABNORMAL HIGH (ref 0–37)
Albumin: 4.8 g/dL (ref 3.5–5.2)
Alkaline Phosphatase: 49 U/L (ref 39–117)
BUN: 15 mg/dL (ref 6–23)
CO2: 22 mEq/L (ref 19–32)
Calcium: 10.1 mg/dL (ref 8.4–10.5)
Chloride: 103 mEq/L (ref 96–112)
Creat: 1.08 mg/dL (ref 0.50–1.35)
GFR, Est African American: >60 mL/min (ref >60)
GFR, Est Non African American: >60 mL/min (ref >60)
Glucose, Bld: 88 mg/dL (ref 70–99)
Potassium: 3.8 mEq/L (ref 3.5–5.3)
Sodium: 138 mEq/L (ref 135–145)
Total Bilirubin: 0.6 mg/dL (ref 0.3–1.2)
Total Protein: 7.8 g/dL (ref 6.0–8.3)

## 2011-05-04 LAB — LIPASE: Lipase: 49 U/L (ref 0–75)

## 2011-05-04 NOTE — Telephone Encounter (Signed)
Call pt: No sign of infection.  Pancreas is normal. Liver enzymes are stable and actually look better this time.

## 2011-05-05 NOTE — Telephone Encounter (Signed)
Pt advised of results. 

## 2011-05-09 DIAGNOSIS — IMO0001 Reserved for inherently not codable concepts without codable children: Secondary | ICD-10-CM

## 2011-05-09 HISTORY — DX: Reserved for inherently not codable concepts without codable children: IMO0001

## 2011-05-10 ENCOUNTER — Encounter: Payer: Self-pay | Admitting: Family Medicine

## 2011-09-04 ENCOUNTER — Other Ambulatory Visit: Payer: Self-pay | Admitting: Family Medicine

## 2011-09-05 ENCOUNTER — Encounter: Payer: Self-pay | Admitting: Family Medicine

## 2011-09-05 ENCOUNTER — Ambulatory Visit (INDEPENDENT_AMBULATORY_CARE_PROVIDER_SITE_OTHER): Payer: BC Managed Care – PPO | Admitting: Family Medicine

## 2011-09-05 VITALS — BP 124/77 | HR 22 | Wt 314.0 lb

## 2011-09-05 DIAGNOSIS — I10 Essential (primary) hypertension: Secondary | ICD-10-CM

## 2011-09-05 DIAGNOSIS — K760 Fatty (change of) liver, not elsewhere classified: Secondary | ICD-10-CM

## 2011-09-05 DIAGNOSIS — K7689 Other specified diseases of liver: Secondary | ICD-10-CM

## 2011-09-05 DIAGNOSIS — Z23 Encounter for immunization: Secondary | ICD-10-CM

## 2011-09-05 MED ORDER — LOSARTAN POTASSIUM-HCTZ 100-25 MG PO TABS: 1.0000 | ORAL_TABLET | Freq: Every day | ORAL | Status: DC

## 2011-09-05 NOTE — Progress Notes (Signed)
  Subjective:    Patient ID: Marcus Torres, male    DOB: 27-Sep-1976, 35 y.o.   MRN: [id number]  Hypertension This is a chronic problem. The current episode started more than 1 year ago. The problem is unchanged. The problem is controlled. Pertinent negatives include no blurred vision, chest pain or shortness of breath. There are no associated agents to hypertension. Past treatments include ACE inhibitors and diuretics. The current treatment provides moderate improvement. Compliance problems include diet and exercise.    He has gained wt since his last OV.    Review of Systems  Eyes: Negative for blurred vision.  Respiratory: Negative for shortness of breath.   Cardiovascular: Negative for chest pain.       Objective:   Physical Exam  Constitutional: He is oriented to person, place, and time. He appears well-developed and well-nourished.  HENT:  Head: Normocephalic and atraumatic.  Cardiovascular: Normal rate, regular rhythm and normal heart sounds.        No carotid bruits.   Pulmonary/Chest: Effort normal and breath sounds normal.  Musculoskeletal: He exhibits no edema.  Neurological: He is alert and oriented to person, place, and time.  Skin: Skin is warm and dry.  Psychiatric: He has a normal mood and affect.          Assessment & Plan:  HTN- Well controlled today. At goal. F/U in 6 months. Discussed the importance of weight loss and regular exercise.   Due for chol check. Will check in Feb with liver enymes.   Fatty liver - Enzymes were better in July. Recheck in 6 mo ( early FEB). Also need to minimize alchohol intake.   Given Tdap ad flu today.

## 2011-09-05 NOTE — Patient Instructions (Addendum)
Call in early February to have liver enzymes and cholesterol rechecked.

## 2011-11-23 ENCOUNTER — Other Ambulatory Visit: Payer: Self-pay | Admitting: Family Medicine

## 2012-03-21 ENCOUNTER — Telehealth: Payer: Self-pay | Admitting: *Deleted

## 2012-03-21 DIAGNOSIS — E785 Hyperlipidemia, unspecified: Secondary | ICD-10-CM

## 2012-03-21 NOTE — Telephone Encounter (Signed)
Pt is scheduled with you Friday for an appt to f/u and have lipid panel and liver enzymes done. Is there any other labs you would like for me to add while drawing these? Pt doesn't want to wait to fast that long since appt is at 10:45am and can go to lab before appt. Please advise.

## 2012-03-21 NOTE — Telephone Encounter (Signed)
CMP w/ GFR and lipids should be ok

## 2012-03-23 ENCOUNTER — Encounter: Payer: Self-pay | Admitting: Family Medicine

## 2012-03-23 ENCOUNTER — Ambulatory Visit (INDEPENDENT_AMBULATORY_CARE_PROVIDER_SITE_OTHER): Payer: BC Managed Care – PPO | Admitting: Family Medicine

## 2012-03-23 VITALS — BP 122/90 | HR 70 | Ht 75.25 in | Wt 319.0 lb

## 2012-03-23 DIAGNOSIS — R5381 Other malaise: Secondary | ICD-10-CM

## 2012-03-23 DIAGNOSIS — R5383 Other fatigue: Secondary | ICD-10-CM

## 2012-03-23 DIAGNOSIS — I10 Essential (primary) hypertension: Secondary | ICD-10-CM

## 2012-03-23 DIAGNOSIS — K7689 Other specified diseases of liver: Secondary | ICD-10-CM

## 2012-03-23 DIAGNOSIS — K760 Fatty (change of) liver, not elsewhere classified: Secondary | ICD-10-CM

## 2012-03-23 DIAGNOSIS — E669 Obesity, unspecified: Secondary | ICD-10-CM

## 2012-03-23 NOTE — Progress Notes (Signed)
  Subjective:    Patient ID: Marcus Torres, male    DOB: October 08, 1976, 36 y.o.   MRN: [id number]  HPI HTN- NO CP ro SOB. Taking meds. No SE. Taking them regularly.  occ feels lightheaded. No HA.  No swelling.  Has gained 5 lbs. No low salt diet. No regular exercise.   Fatigue - sleeping well.  Snoring occ.  Feels not motivated to exercise.  No excessive daytime sleepiness.  No fever. No skin or hair changes. No family hx of thyroid problems.  He says he would like to have "everything" checked.  Review of Systems     Objective:   Physical Exam  Constitutional: He is oriented to person, place, and time. He appears well-developed and well-nourished.  HENT:  Head: Normocephalic and atraumatic.  Cardiovascular: Normal rate, regular rhythm and normal heart sounds.        No carotid  Bruits.  No abdominal bruits.   Pulmonary/Chest: Effort normal and breath sounds normal.  Abdominal: Soft. Bowel sounds are normal. He exhibits no distension and no mass. There is no tenderness. There is no rebound and no guarding.  Neurological: He is alert and oriented to person, place, and time.  Skin: Skin is warm and dry.  Psychiatric: He has a normal mood and affect. His behavior is normal.          Assessment & Plan:  HTN - Not at goal. Work on diet, exercie, weight loss and continue current reg and f/u in one month. If he is not at goal in one month and we will add a beta blocker at bedtime. Systolic is well-controlled his diastolic is elevated today. Encouraged and reminded him to work on low-salt diet as well.  Fatigue - Will check TSH, CBC to rule out anemia, and he testosterone. If these are all normal then I strongly encouraged him to try to create some type of exercise program and really make some impact on his diet.  Obesity - Discuss weight loss. Cutting artificial sweetners.  Handout provided on fatty liver disease.  Fatty liver- we discussed the importance of diet and weight loss and regular  exercise to improve his fatty liver. Right now he is not very motivated.

## 2012-03-23 NOTE — Patient Instructions (Signed)
Fatty Liver Fatty liver is the accumulation of fat in liver cells. It is also called hepatosteatosis or steatohepatitis. It is normal for your liver to contain some fat. If fat is more than 5 to 10% of your liver's weight, you have fatty liver.  There are often no symptoms (problems) for years while damage is still occurring. People often learn about their fatty liver when they have medical tests for other reasons. Fat can damage your liver for years or even decades without causing problems. When it becomes severe, it can cause fatigue, weight loss, weakness, and confusion. This makes you more likely to develop more serious liver problems. The liver is the largest organ in the body. It does a lot of work and often gives no warning signs when it is sick until late in a disease. The liver has many important jobs including:  Breaking down foods.   Storing vitamins, iron, and other minerals.   Making proteins.   Making bile for food digestion.   Breaking down many products including medications, alcohol and some poisons.  CAUSES  There are a number of different conditions, medications, and poisons that can cause a fatty liver. Eating too many calories causes fat to build up in the liver. Not processing and breaking fats down normally may also cause this. Certain conditions, such as obesity, diabetes, and high triglycerides also cause this. Most fatty liver patients tend to be middle-aged and over weight.  Some causes of fatty liver are:  Alcohol over consumption.   Malnutrition.   Steroid use.   Valproic acid toxicity.   Obesity.   Cushing's syndrome.   Poisons.   Tetracycline in high dosages.   Pregnancy.   Diabetes.   Hyperlipidemia.   Rapid weight loss.  Some people develop fatty liver even having none of these conditions. SYMPTOMS  Fatty liver most often causes no problems. This is called asymptomatic.  It can be diagnosed with blood tests and also by a liver biopsy.    It is one of the most common causes of minor elevations of liver enzymes on routine blood tests.   Specialized Imaging of the liver using ultrasound, CT (computed tomography) scan, or MRI (magnetic resonance imaging) can suggest a fatty liver but a biopsy is needed to confirm it.   A biopsy involves taking a small sample of liver tissue. This is done by using a needle. It is then looked at under a microscope by a specialist.  TREATMENT  It is important to treat the cause. Simple fatty liver without a medical reason may not need treatment.  Weight loss, fat restriction, and exercise in overweight patients produces inconsistent results but is worth trying.   Fatty liver due to alcohol toxicity may not improve even with stopping drinking.   Good control of diabetes may reduce fatty liver.   Lower your triglycerides through diet, medication or both.   Eat a balanced, healthy diet.   Increase your physical activity.   Get regular checkups from a liver specialist.   There are no medical or surgical treatments for a fatty liver or NASH, but improving your diet and increasing your exercise may help prevent or reverse some of the damage.  PROGNOSIS  Fatty liver may cause no damage or it can lead to an inflammation of the liver. This is, called steatohepatitis. When it is linked to alcohol abuse, it is called alcoholic steatohepatitis. It often is not linked to alcohol. It is then called nonalcoholic steatohepatitis, or NASH. Over   time the liver may become scarred and hardened. This condition is called cirrhosis. Cirrhosis is serious and may lead to liver failure or cancer. NASH is one of the leading causes of cirrhosis. About 10-20% of Americans have fatty liver and a smaller 2-5% has NASH. Document Released: 12/02/2005 Document Revised: 10/06/2011 Document Reviewed: 01/25/2006 [employer] Patient Information 2012 [employer]. 

## 2012-03-24 LAB — CBC
HCT: 41.5 % (ref 39.0–52.0)
Hemoglobin: 14.6 g/dL (ref 13.0–17.0)
MCH: 30.4 pg (ref 26.0–34.0)
MCHC: 35.2 g/dL (ref 30.0–36.0)
MCV: 86.5 fL (ref 78.0–100.0)
Platelets: 171 10*3/uL (ref 150–400)
RBC: 4.8 MIL/uL (ref 4.22–5.81)
RDW: 13.8 % (ref 11.5–15.5)
WBC: 3.8 10*3/uL — ABNORMAL LOW (ref 4.0–10.5)

## 2012-03-24 LAB — LIPID PANEL
Cholesterol: 296 mg/dL — ABNORMAL HIGH (ref 0–200)
HDL: 42 mg/dL (ref >39)
Total CHOL/HDL Ratio: 7 Ratio
Triglycerides: 800 mg/dL — ABNORMAL HIGH (ref <150)

## 2012-03-24 LAB — COMPLETE METABOLIC PANEL WITH GFR
ALT: 131 U/L — ABNORMAL HIGH (ref 0–53)
AST: 93 U/L — ABNORMAL HIGH (ref 0–37)
Albumin: 4.9 g/dL (ref 3.5–5.2)
Alkaline Phosphatase: 53 U/L (ref 39–117)
BUN: 13 mg/dL (ref 6–23)
CO2: 27 mEq/L (ref 19–32)
Calcium: 9.9 mg/dL (ref 8.4–10.5)
Chloride: 103 mEq/L (ref 96–112)
Creat: 1.02 mg/dL (ref 0.50–1.35)
GFR, Est African American: >89 mL/min
GFR, Est Non African American: >89 mL/min
Glucose, Bld: 91 mg/dL (ref 70–99)
Potassium: 4 mEq/L (ref 3.5–5.3)
Sodium: 140 mEq/L (ref 135–145)
Total Bilirubin: 0.7 mg/dL (ref 0.3–1.2)
Total Protein: 7.8 g/dL (ref 6.0–8.3)

## 2012-03-24 LAB — TSH: TSH: 0.854 u[IU]/mL (ref 0.350–4.500)

## 2012-03-27 ENCOUNTER — Telehealth: Payer: Self-pay | Admitting: *Deleted

## 2012-03-27 LAB — TESTOSTERONE, FREE, TOTAL, SHBG
Sex Hormone Binding: 12 nmol/L — ABNORMAL LOW (ref 13–71)
Testosterone, Free: 73.5 pg/mL (ref 47.0–244.0)
Testosterone-% Free: 3 % — ABNORMAL HIGH (ref 1.6–2.9)
Testosterone: 242.59 ng/dL — ABNORMAL LOW (ref 300–890)

## 2012-03-27 NOTE — Telephone Encounter (Signed)
Wife called asking about pt's lab results. Please advise.

## 2012-03-27 NOTE — Telephone Encounter (Signed)
Testosterone is a send out lab so we do not have the full results completely back today. Should hopefully get the full result either later today or first thing in the morning.

## 2012-03-28 ENCOUNTER — Other Ambulatory Visit: Payer: Self-pay | Admitting: Family Medicine

## 2012-03-28 MED ORDER — OMEGA-3-ACID ETHYL ESTERS 1 G PO CAPS: 2.0000 g | ORAL_CAPSULE | Freq: Two times a day (BID) | ORAL | Status: DC

## 2012-03-28 NOTE — Telephone Encounter (Signed)
Wife informed

## 2012-04-12 ENCOUNTER — Encounter: Payer: Self-pay | Admitting: *Deleted

## 2012-04-20 ENCOUNTER — Ambulatory Visit: Payer: BC Managed Care – PPO | Admitting: Family Medicine

## 2012-04-25 ENCOUNTER — Other Ambulatory Visit: Payer: Self-pay | Admitting: Family Medicine

## 2012-04-26 ENCOUNTER — Telehealth: Payer: Self-pay | Admitting: *Deleted

## 2012-04-26 DIAGNOSIS — R7989 Other specified abnormal findings of blood chemistry: Secondary | ICD-10-CM

## 2012-04-26 DIAGNOSIS — R5383 Other fatigue: Secondary | ICD-10-CM

## 2012-04-26 NOTE — Telephone Encounter (Signed)
Labs entered.

## 2012-04-28 LAB — CBC
HCT: 40.7 % (ref 39.0–52.0)
Hemoglobin: 14.4 g/dL (ref 13.0–17.0)
MCH: 30.6 pg (ref 26.0–34.0)
MCHC: 35.4 g/dL (ref 30.0–36.0)
MCV: 86.6 fL (ref 78.0–100.0)
Platelets: 163 10*3/uL (ref 150–400)
RBC: 4.7 MIL/uL (ref 4.22–5.81)
RDW: 13.8 % (ref 11.5–15.5)
WBC: 4.3 10*3/uL (ref 4.0–10.5)

## 2012-04-28 LAB — TESTOSTERONE: Testosterone: 242.45 ng/dL — ABNORMAL LOW (ref 300–890)

## 2012-06-30 ENCOUNTER — Other Ambulatory Visit: Payer: Self-pay | Admitting: Family Medicine

## 2012-08-19 ENCOUNTER — Other Ambulatory Visit: Payer: Self-pay | Admitting: Family Medicine

## 2012-09-28 ENCOUNTER — Other Ambulatory Visit: Payer: Self-pay | Admitting: Family Medicine

## 2012-11-07 ENCOUNTER — Other Ambulatory Visit: Payer: Self-pay | Admitting: Family Medicine

## 2012-12-17 ENCOUNTER — Other Ambulatory Visit: Payer: Self-pay | Admitting: Family Medicine

## 2013-02-05 ENCOUNTER — Other Ambulatory Visit: Payer: Self-pay | Admitting: Family Medicine

## 2013-02-10 ENCOUNTER — Other Ambulatory Visit: Payer: Self-pay | Admitting: Family Medicine

## 2013-02-11 NOTE — Telephone Encounter (Signed)
Must make appt

## 2013-04-11 ENCOUNTER — Other Ambulatory Visit: Payer: Self-pay | Admitting: Family Medicine

## 2013-04-12 ENCOUNTER — Other Ambulatory Visit: Payer: Self-pay | Admitting: Family Medicine

## 2013-04-18 ENCOUNTER — Other Ambulatory Visit: Payer: Self-pay | Admitting: Family Medicine

## 2013-05-02 ENCOUNTER — Other Ambulatory Visit: Payer: Self-pay | Admitting: Family Medicine

## 2013-05-02 MED ORDER — LOSARTAN POTASSIUM-HCTZ 100-25 MG PO TABS: ORAL_TABLET | ORAL | Status: DC

## 2013-05-21 ENCOUNTER — Ambulatory Visit (INDEPENDENT_AMBULATORY_CARE_PROVIDER_SITE_OTHER): Payer: BC Managed Care – PPO | Admitting: Family Medicine

## 2013-05-21 ENCOUNTER — Encounter: Payer: Self-pay | Admitting: Family Medicine

## 2013-05-21 VITALS — BP 136/102 | HR 70 | Wt 321.0 lb

## 2013-05-21 DIAGNOSIS — I10 Essential (primary) hypertension: Secondary | ICD-10-CM

## 2013-05-21 MED ORDER — LOSARTAN POTASSIUM-HCTZ 100-25 MG PO TABS: ORAL_TABLET | ORAL | Status: DC

## 2013-05-21 MED ORDER — OLMESARTAN-AMLODIPINE-HCTZ 40-5-25 MG PO TABS: 1.0000 | ORAL_TABLET | Freq: Every day | ORAL | Status: DC

## 2013-05-21 NOTE — Progress Notes (Signed)
  Subjective:    Patient ID: Marcus Torres, male    DOB: 06-14-1976, 37 y.o.   MRN: [id number]  HPI HTN-  Pt denies chest pain, SOB, dizziness, or heart palpitations.  Taking meds as directed w/o problems.  Denies medication side effects.  He is not exercising and has actually gained a couple more pounds. He admits that he has not been very compliant. That he has really tried hard to take his blood pressure medication daily for 2 weeks.   Review of Systems     Objective:   Physical Exam  Constitutional: He is oriented to person, place, and time. He appears well-developed and well-nourished.  HENT:  Head: Normocephalic and atraumatic.  Cardiovascular: Normal rate, regular rhythm and normal heart sounds.   Pulmonary/Chest: Effort normal and breath sounds normal.  Neurological: He is alert and oriented to person, place, and time.  Skin: Skin is warm and dry.  Psychiatric: He has a normal mood and affect. His behavior is normal.          Assessment & Plan:  HTN -  Pt denies chest pain, SOB, dizziness, or heart palpitations.  Taking meds as directed w/o problems.  Denies medication side effects.  We'll discontinue losartan HCTZ and change to Tribenzor. I had them amlodipine we will have little bit better control of his diastolic blood pressure. Did warn about potential for ankle swelling and to monitor for this. Strongly encouraged him to continue to work on healthy diet, low salt diet, weight loss, and regular exercise. I know this would make a huge impact on his blood pressures. Coupon card given for [employer].

## 2013-05-24 ENCOUNTER — Other Ambulatory Visit: Payer: Self-pay | Admitting: Family Medicine

## 2013-06-11 ENCOUNTER — Telehealth: Payer: Self-pay | Admitting: *Deleted

## 2013-06-11 MED ORDER — AMLODIPINE BESYLATE 5 MG PO TABS: 5.0000 mg | ORAL_TABLET | Freq: Every day | ORAL | Status: DC

## 2013-06-11 MED ORDER — LOSARTAN POTASSIUM-HCTZ 100-25 MG PO TABS: ORAL_TABLET | ORAL | Status: DC

## 2013-06-11 NOTE — Telephone Encounter (Signed)
Patient's wife notified of rx & recommendations.  She wants to know if he should still be taking the lovaza as well.  If so then he will need a refill.

## 2013-06-11 NOTE — Telephone Encounter (Signed)
OK will continue the losartan HCT and add amlodipine to help reduce his bottom number on his BP.  Can take the losartan HCT in Am and the amlodipine at bedtime. Sent new prescriptions over for both. Keep a followup appointment in September.

## 2013-06-11 NOTE — Telephone Encounter (Signed)
There is no medical reason why his urine output she did change. The diuretic is exactly what he was on before. Exact same medication and the exact same dose. The only other possibility is that I gave him samples that had a slightly decreased dose because we were out of the one that the 25 mg. And so he can pick up his prescription. I would strongly encourage him to retry the new medication. The reason I changed her meds because he was not well-controlled.

## 2013-06-11 NOTE — Telephone Encounter (Signed)
Spoke with patient's wife & explained to her your recommendations.  She states that he will more than likely NOT retry the med.  She is asking if there is anything else that he can possibly try, then if that doesn't work he would be more apt to come back in for a f/u.  Please advise.

## 2013-06-11 NOTE — Telephone Encounter (Signed)
Patient's wife left message stating that after 3-4 days of taking the tribenzor that you started him on last month, he started experiencing a decrease in urine output.  He then went back on the old bp med & now he is almost out of this.  Wife wants to know if you want to try something different or just refill the old med.  Please advise.

## 2013-06-12 MED ORDER — OMEGA-3-ACID ETHYL ESTERS 1 G PO CAPS: 2.0000 g | ORAL_CAPSULE | Freq: Two times a day (BID) | ORAL | Status: DC

## 2013-06-12 NOTE — Telephone Encounter (Signed)
Pt wife notified to continue the Lovaza and refill sent to pharmacy. Barry Dienes, LPN

## 2013-06-12 NOTE — Telephone Encounter (Signed)
Yes, please fill the Lovaza

## 2013-07-24 ENCOUNTER — Ambulatory Visit: Payer: BC Managed Care – PPO | Admitting: Family Medicine

## 2013-08-06 ENCOUNTER — Other Ambulatory Visit: Payer: Self-pay | Admitting: Family Medicine

## 2013-09-02 ENCOUNTER — Encounter: Payer: Self-pay | Admitting: Family Medicine

## 2013-09-02 DIAGNOSIS — N200 Calculus of kidney: Secondary | ICD-10-CM | POA: Insufficient documentation

## 2013-09-30 ENCOUNTER — Other Ambulatory Visit: Payer: Self-pay | Admitting: Family Medicine

## 2013-10-21 ENCOUNTER — Other Ambulatory Visit: Payer: Self-pay | Admitting: Family Medicine

## 2013-10-21 LAB — COMPLETE METABOLIC PANEL WITH GFR
ALT: 131 U/L — ABNORMAL HIGH (ref 0–53)
AST: 98 U/L — ABNORMAL HIGH (ref 0–37)
Albumin: 4.4 g/dL (ref 3.5–5.2)
Alkaline Phosphatase: 55 U/L (ref 39–117)
BUN: 17 mg/dL (ref 6–23)
CO2: 24 mEq/L (ref 19–32)
Calcium: 9.3 mg/dL (ref 8.4–10.5)
Chloride: 102 mEq/L (ref 96–112)
Creat: 0.86 mg/dL (ref 0.50–1.35)
GFR, Est African American: 126 mL/min
GFR, Est Non African American: 109 mL/min
Glucose, Bld: 109 mg/dL — ABNORMAL HIGH (ref 70–99)
Potassium: 4.4 mEq/L (ref 3.5–5.3)
Sodium: 139 mEq/L (ref 135–145)
Total Bilirubin: 0.5 mg/dL (ref 0.3–1.2)
Total Protein: 7.4 g/dL (ref 6.0–8.3)

## 2013-10-21 LAB — LIPID PANEL
Cholesterol: 278 mg/dL — ABNORMAL HIGH (ref 0–200)
HDL: 50 mg/dL (ref >39)
Total CHOL/HDL Ratio: 5.6 Ratio
Triglycerides: 657 mg/dL — ABNORMAL HIGH (ref <150)

## 2013-10-22 ENCOUNTER — Other Ambulatory Visit: Payer: Self-pay | Admitting: Family Medicine

## 2013-10-22 DIAGNOSIS — R748 Abnormal levels of other serum enzymes: Secondary | ICD-10-CM

## 2013-10-22 LAB — LDL CHOLESTEROL, DIRECT: Direct LDL: 126 mg/dL — ABNORMAL HIGH

## 2013-11-01 ENCOUNTER — Ambulatory Visit (INDEPENDENT_AMBULATORY_CARE_PROVIDER_SITE_OTHER): Payer: BC Managed Care – PPO | Admitting: Family Medicine

## 2013-11-01 ENCOUNTER — Encounter: Payer: Self-pay | Admitting: Family Medicine

## 2013-11-01 VITALS — BP 150/91 | HR 93 | Temp 97.9°F | Ht 75.0 in | Wt 327.0 lb

## 2013-11-01 DIAGNOSIS — I10 Essential (primary) hypertension: Secondary | ICD-10-CM

## 2013-11-01 MED ORDER — OLMESARTAN-AMLODIPINE-HCTZ 40-5-25 MG PO TABS: 1.0000 | ORAL_TABLET | Freq: Every day | ORAL | Status: DC

## 2013-11-01 NOTE — Progress Notes (Signed)
   Subjective:    Patient ID: Marcus Torres, male    DOB: [birth year], 38 y.o.   MRN: [id number]  HPI Hypertension- Pt denies chest pain, SOB, dizziness, or heart palpitations.  Taking meds as directed w/o problems.  Denies medication side effects.  pt reports he has not been watching salt intake. He is taking the Tribenzor.   Obesity - has continued to gain weight. He is 30 + lbs heavier than 2 years ago. Overall he feels he eats pretty healthy. He says portion sizes or sweat he struggles with the most. He is not currently exercising.  Review of Systems     Objective:   Physical Exam  Constitutional: He is oriented to person, place, and time. He appears well-developed and well-nourished.  HENT:  Head: Normocephalic and atraumatic.  Cardiovascular: Normal rate, regular rhythm and normal heart sounds.   Pulmonary/Chest: Effort normal and breath sounds normal.  Neurological: He is alert and oriented to person, place, and time.  Skin: Skin is warm and dry.  Psychiatric: He has a normal mood and affect. His behavior is normal.          Assessment & Plan:  HTN - uncontrolled. He really wants to work on diet and exercise before adjusting the medication. F/U in 2 months. If not maximally controlled then, then we will adjust his dose to 40/10/25.    Obesity/BMI 40, had long discussion about her to get motivated to start exercising and dieting again. We discussed strategies to do this. Also discussed potential referral to my former partner Dr. Loyal Gambler for weight loss. He says he is actually interested in doing that. In the meantime work on portion sizes. Will place referral.

## 2013-12-05 ENCOUNTER — Ambulatory Visit (INDEPENDENT_AMBULATORY_CARE_PROVIDER_SITE_OTHER): Payer: BC Managed Care – PPO

## 2013-12-05 ENCOUNTER — Encounter: Payer: Self-pay | Admitting: Sports Medicine

## 2013-12-05 ENCOUNTER — Ambulatory Visit (INDEPENDENT_AMBULATORY_CARE_PROVIDER_SITE_OTHER): Payer: BC Managed Care – PPO | Admitting: Sports Medicine

## 2013-12-05 VITALS — BP 143/87 | HR 80 | Ht 74.0 in | Wt 331.0 lb

## 2013-12-05 DIAGNOSIS — M51369 Other intervertebral disc degeneration, lumbar region without mention of lumbar back pain or lower extremity pain: Secondary | ICD-10-CM | POA: Insufficient documentation

## 2013-12-05 DIAGNOSIS — M5136 Other intervertebral disc degeneration, lumbar region: Secondary | ICD-10-CM

## 2013-12-05 DIAGNOSIS — M5137 Other intervertebral disc degeneration, lumbosacral region: Secondary | ICD-10-CM

## 2013-12-05 DIAGNOSIS — M51379 Other intervertebral disc degeneration, lumbosacral region without mention of lumbar back pain or lower extremity pain: Secondary | ICD-10-CM

## 2013-12-05 DIAGNOSIS — M47817 Spondylosis without myelopathy or radiculopathy, lumbosacral region: Secondary | ICD-10-CM

## 2013-12-05 MED ORDER — PREDNISONE 50 MG PO TABS: ORAL_TABLET | ORAL | Status: DC

## 2013-12-05 MED ORDER — MELOXICAM 15 MG PO TABS: ORAL_TABLET | ORAL | Status: DC

## 2013-12-05 NOTE — Assessment & Plan Note (Signed)
History of bending over and coughing this likely represent a herniated disc. Pain his axial and discogenic. We will start conservatively with physical therapy, steroids, Mobic, x-rays. Return in one month, MRI if no better.

## 2013-12-05 NOTE — Progress Notes (Signed)
   Subjective:    I'm seeing this patient as a consultation for:  Dr. Madilyn Fireman  CC: Back pain  HPI: This is a very pleasant 38 year old male, several days ago he bent over in the shower, cough, and felt immediate pain that he localized in the right side of his low back without radiation. Since then the pain has been a constant dull ache, worse when sitting, worse with flexion, worse with Valsalva. He denies any constitutional symptoms denies any radiation of the pain, no bowel or bladder dysfunction, pain is moderate, persistent.  Past medical history, Surgical history, Family history not pertinant except as noted below, Social history, Allergies, and medications have been entered into the medical record, reviewed, and no changes needed.   Review of Systems: No headache, visual changes, nausea, vomiting, diarrhea, constipation, dizziness, abdominal pain, skin rash, fevers, chills, night sweats, weight loss, swollen lymph nodes, body aches, joint swelling, muscle aches, chest pain, shortness of breath, mood changes, visual or auditory hallucinations.   Objective:   General: Well Developed, well nourished, and in no acute distress.  Neuro/Psych: Alert and oriented x3, extra-ocular muscles intact, able to move all 4 extremities, sensation grossly intact. Skin: Warm and dry, no rashes noted.  Respiratory: Not using accessory muscles, speaking in full sentences, trachea midline.  Cardiovascular: Pulses palpable, no extremity edema. Abdomen: Does not appear distended. Back Exam:  Inspection: Unremarkable  Motion: Flexion 45 deg, Extension 45 deg, Side Bending to 45 deg bilaterally,  Rotation to 45 deg bilaterally  SLR laying: Recreates back pain but no radicular symptoms.  XSLR laying: Negative  Palpable tenderness: None. FABER: negative. Sensory change: Gross sensation intact to all lumbar and sacral dermatomes.  Reflexes: 2+ at both patellar tendons, 2+ at achilles tendons, Babinski's  downgoing.  Strength at foot  Plantar-flexion: 5/5 Dorsi-flexion: 5/5 Eversion: 5/5 Inversion: 5/5  Leg strength  Quad: 5/5 Hamstring: 5/5 Hip flexor: 5/5 Hip abductors: 5/5  Gait unremarkable.  Lumbar spine x-rays were reviewed and show multilevel degenerative disc disease worst at the L4-L5 level.  Impression and Recommendations:   This case required medical decision making of moderate complexity.

## 2013-12-06 ENCOUNTER — Telehealth: Payer: Self-pay

## 2013-12-06 NOTE — Telephone Encounter (Signed)
Patient called stated that he is still experiencing back pain after he have been laying on it for a while. I advised patient to give the pain medication time to work and call the office if no better in a week. Rhonda Cunningham,CMA

## 2013-12-06 NOTE — Telephone Encounter (Signed)
Agree, it's only been one day since I last saw him.

## 2014-01-01 ENCOUNTER — Ambulatory Visit: Payer: BC Managed Care – PPO | Admitting: Family Medicine

## 2014-03-04 ENCOUNTER — Other Ambulatory Visit: Payer: Self-pay | Admitting: Family Medicine

## 2014-07-08 ENCOUNTER — Encounter: Payer: Self-pay | Admitting: Emergency Medicine

## 2014-07-08 ENCOUNTER — Telehealth: Payer: Self-pay | Admitting: Emergency Medicine

## 2014-07-08 ENCOUNTER — Emergency Department (INDEPENDENT_AMBULATORY_CARE_PROVIDER_SITE_OTHER): Payer: BC Managed Care – PPO

## 2014-07-08 ENCOUNTER — Emergency Department (INDEPENDENT_AMBULATORY_CARE_PROVIDER_SITE_OTHER)
Admission: EM | Admit: 2014-07-08 | Discharge: 2014-07-08 | Disposition: A | Discharge status: Home / Self Care | Payer: BC Managed Care – PPO | Source: Home / Self Care | Attending: Physician Assistant | Admitting: Physician Assistant

## 2014-07-08 DIAGNOSIS — I10 Essential (primary) hypertension: Secondary | ICD-10-CM

## 2014-07-08 DIAGNOSIS — R079 Chest pain, unspecified: Secondary | ICD-10-CM

## 2014-07-08 DIAGNOSIS — R748 Abnormal levels of other serum enzymes: Secondary | ICD-10-CM

## 2014-07-08 DIAGNOSIS — E781 Pure hyperglyceridemia: Secondary | ICD-10-CM

## 2014-07-08 LAB — COMPLETE METABOLIC PANEL WITH GFR
ALT: 221 U/L — ABNORMAL HIGH (ref 0–53)
AST: 170 U/L — ABNORMAL HIGH (ref 0–37)
Albumin: 4.6 g/dL (ref 3.5–5.2)
Alkaline Phosphatase: 82 U/L (ref 39–117)
BUN: 10 mg/dL (ref 6–23)
CO2: 22 mEq/L (ref 19–32)
Calcium: 9.4 mg/dL (ref 8.4–10.5)
Chloride: 102 mEq/L (ref 96–112)
Creat: 0.88 mg/dL (ref 0.50–1.35)
GFR, Est African American: >89 mL/min
GFR, Est Non African American: >89 mL/min
Glucose, Bld: 125 mg/dL — ABNORMAL HIGH (ref 70–99)
Potassium: 4.4 mEq/L (ref 3.5–5.3)
Sodium: 140 mEq/L (ref 135–145)
Total Bilirubin: 0.5 mg/dL (ref 0.2–1.2)
Total Protein: 7.7 g/dL (ref 6.0–8.3)

## 2014-07-08 LAB — CK TOTAL AND CKMB (NOT AT ARMC)
CK, MB: 8.5 ng/mL — ABNORMAL HIGH (ref 0.0–5.0)
Relative Index: 1.2 (ref 0.0–4.0)
Total CK: 710 U/L — ABNORMAL HIGH (ref 7–232)

## 2014-07-08 LAB — TROPONIN I: Troponin I: 0.01 ng/mL (ref <0.06)

## 2014-07-08 MED ORDER — METOPROLOL TARTRATE 50 MG PO TABS: 50.0000 mg | ORAL_TABLET | Freq: Two times a day (BID) | ORAL | Status: DC

## 2014-07-08 MED ORDER — NITROGLYCERIN 0.4 MG SL SUBL: 0.4000 mg | SUBLINGUAL_TABLET | SUBLINGUAL | Status: DC | PRN

## 2014-07-08 MED ORDER — OMEGA-3-ACID ETHYL ESTERS 1 G PO CAPS: 2.0000 g | ORAL_CAPSULE | Freq: Two times a day (BID) | ORAL | Status: DC

## 2014-07-08 MED ORDER — OLMESARTAN-AMLODIPINE-HCTZ 40-10-25 MG PO TABS: ORAL_TABLET | ORAL | Status: DC

## 2014-07-08 MED ORDER — NITROGLYCERIN 0.4 MG SL SUBL
0.4000 mg | SUBLINGUAL_TABLET | SUBLINGUAL | Status: DC | PRN
  Administered 2014-07-08 (×2): 0.4 mg via SUBLINGUAL

## 2014-07-08 NOTE — ED Provider Notes (Signed)
CSN: [id number]     Arrival date & time 07/08/14  6834 History   First MD Initiated Contact with Patient 07/08/14 0848     Chief Complaint  Patient presents with  . Chest Pain   (Consider location/radiation/quality/duration/timing/severity/associated sxs/prior Treatment) HPI Pt is a 38 yo male with intermittent chest pain at worst 5/10 for last 2 weeks. Per pt he has continued to work and not noticed any worsening of pain with exertion. No acid reflux or worsening of symptoms with eating. No anxiety noted. Pain occasionally will no radiation but occur intermittently in left jaw and left neck and left arm pain. He had pain this am but not currently having pain. He reports that coming today was a part of his "to-do list" he felt like needed to be evaluated.  Has HTN and hypertriglyceridemia and not taking medications regularly. He did take one tribenzor this morning that was a left over sample pack from PCP Dr. Madilyn Fireman. He does smoke 2 cigarettes a day.  Last stress test 05/2011 and normal.    Past Medical History  Diagnosis Date  . Hypertension   . Elevated liver enzymes   . Alcohol abuse   . Normal cardiac stress test 05/09/2011    Negative treadmill performed at [facility] cardiology   History reviewed. No pertinent past surgical history. Family History  Problem Relation Age of Onset  . Hypertension Mother   . Hyperlipidemia Mother   . Other Other     Cardiac stent   History  Substance Use Topics  . Smoking status: Current Every Day Smoker -- 0.25 packs/day    Types: Cigarettes  . Smokeless tobacco: Never Used  . Alcohol Use: 14.0 oz/week    28 drink(s) per week     Comment: per week    Review of Systems  Allergies  Lisinopril-hydrochlorothiazide  Home Medications   Prior to Admission medications   Medication Sig Start Date End Date Taking? Authorizing Provider  meloxicam (MOBIC) 15 MG tablet One tab PO qAM with breakfast for 2 weeks, then daily prn pain. 12/05/13    Silverio Decamp, MD  metoprolol (LOPRESSOR) 50 MG tablet Take 1 tablet (50 mg total) by mouth 2 (two) times daily. 07/08/14   Jade L Breeback, PA-C  nitroGLYCERIN (NITROSTAT) 0.4 MG SL tablet Place 1 tablet (0.4 mg total) under the tongue every 5 (five) minutes as needed for chest pain (or tightness). 07/08/14   Jade L Breeback, PA-C  Olmesartan-Amlodipine-HCTZ 40-10-25 MG TABS Take one tablet daily. 07/08/14   Jade L Breeback, PA-C  omega-3 acid ethyl esters (LOVAZA) 1 G capsule Take 2 capsules (2 g total) by mouth 2 (two) times daily. 07/08/14 07/08/15  Donella Stade, PA-C  omega-3 acid ethyl esters (LOVAZA) 1 G capsule Take 2 capsules (2 g total) by mouth 2 (two) times daily. 07/08/14   Jade L Breeback, PA-C  predniSONE (DELTASONE) 50 MG tablet One tab PO daily for 5 days. 12/05/13   Silverio Decamp, MD  TRIBENZOR 40-5-25 MG TABS TAKE 1 TABLET BY MOUTH DAILY. 03/04/14   Hali Marry, MD   BP 161/98  Pulse 100  Resp 18  SpO2 98% Physical Exam  ED Course  Procedures (including critical care time) Labs Review Labs Reviewed  CK TOTAL AND CKMB  COMPLETE METABOLIC PANEL WITH GFR  TROPONIN I    Imaging Review Dg Chest 2 View  07/08/2014   CLINICAL DATA:  Chest pain  EXAM: CHEST  2 VIEW  COMPARISON:  None.  FINDINGS: The heart size and mediastinal contours are within normal limits. Both lungs are clear. The visualized skeletal structures are unremarkable.  IMPRESSION: No active cardiopulmonary disease.   Electronically Signed   By: Inez Catalina M.D.   On: 07/08/2014 09:48     MDM   1. Chest pain, unspecified chest pain type   2. Essential hypertension, benign   3. Hypertriglyceridemia    EKG- NSR at 92. No ST elevation or depression. No arrthyrmia. No acute changes from last EKG done.  CXR- no acute cardiopulmonary signs.   Stat nitro given with BP drop of 168/104.  Another stat nitro given with BP stable. Not reporting any current chest pains.   Start tribenzor every day  and metoprolol twice a day. Prescriptions handed to patient. Start daily ASA 81 mg.  Start back on Lovaza.   Will call with stat labs today of CKMB/CK/Troponin. Will scheduled for stress test to evaluate chest pain. Last stress test 05/2011 normal.   Use nitroglycerin for CP.   Presented case to Dr. Madilyn Fireman and agreed with plan to do stat cardiac labs and treat BP. Pain has been going on for 2 weeks and not worse today.   Discussed in full warning signs of something more serious and to take nitroglyercin and get to ER can repeat nitro every 5 minutes until arrive at hospital.   Pt instructed not to work today.   Follow up with metheney or myself  in 2-3  days for BP recheck and symptom recheck.   Donella Stade, PA-C 07/08/14 1016  Pt troponin was normal. CKMB levels were elevated along with liver enzymes. Needs to follow up with enzymes with PCP. Pt was called an alerted to go to ER for further evaluation. He in fact could have a partial blockage causing some of his chest pain. Pt aware. We are sending EKG and labs obtained here.   Donella Stade, PA-C 07/08/14 1423

## 2014-07-08 NOTE — Discharge Instructions (Signed)
Start tribenzor every day and metoprolol twice a day.  Start daily ASA 81 mg.  Start back on Lovaza.   Will call with stat labs today.  Will scheduled for stress test to evaluate chest pain.  Use nitroglycerin for CP.  Follow up with metheney or myself  in 2-3  days for BP recheck and symptom recheck.    Chest Pain (Nonspecific) It is often hard to give a specific diagnosis for the cause of chest pain. There is always a chance that your pain could be related to something serious, such as a heart attack or a blood clot in the lungs. You need to follow up with your health care provider for further evaluation. CAUSES   Heartburn.  Pneumonia or bronchitis.  Anxiety or stress.  Inflammation around your heart (pericarditis) or lung (pleuritis or pleurisy).  A blood clot in the lung.  A collapsed lung (pneumothorax). It can develop suddenly on its own (spontaneous pneumothorax) or from trauma to the chest.  Shingles infection (herpes zoster virus). The chest wall is composed of bones, muscles, and cartilage. Any of these can be the source of the pain.  The bones can be bruised by injury.  The muscles or cartilage can be strained by coughing or overwork.  The cartilage can be affected by inflammation and become sore (costochondritis). DIAGNOSIS  Lab tests or other studies may be needed to find the cause of your pain. Your health care provider may have you take a test called an ambulatory electrocardiogram (ECG). An ECG records your heartbeat patterns over a 24-hour period. You may also have other tests, such as:  Transthoracic echocardiogram (TTE). During echocardiography, sound waves are used to evaluate how blood flows through your heart.  Transesophageal echocardiogram (TEE).  Cardiac monitoring. This allows your health care provider to monitor your heart rate and rhythm in real time.  Holter monitor. This is a portable device that records your heartbeat and can help diagnose  heart arrhythmias. It allows your health care provider to track your heart activity for several days, if needed.  Stress tests by exercise or by giving medicine that makes the heart beat faster. TREATMENT   Treatment depends on what may be causing your chest pain. Treatment may include:  Acid blockers for heartburn.  Anti-inflammatory medicine.  Pain medicine for inflammatory conditions.  Antibiotics if an infection is present.  You may be advised to change lifestyle habits. This includes stopping smoking and avoiding alcohol, caffeine, and chocolate.  You may be advised to keep your head raised (elevated) when sleeping. This reduces the chance of acid going backward from your stomach into your esophagus. Most of the time, nonspecific chest pain will improve within 2-3 days with rest and mild pain medicine.  HOME CARE INSTRUCTIONS   If antibiotics were prescribed, take them as directed. Finish them even if you start to feel better.  For the next few days, avoid physical activities that bring on chest pain. Continue physical activities as directed.  Do not use any tobacco products, including cigarettes, chewing tobacco, or electronic cigarettes.  Avoid drinking alcohol.  Only take medicine as directed by your health care provider.  Follow your health care provider's suggestions for further testing if your chest pain does not go away.  Keep any follow-up appointments you made. If you do not go to an appointment, you could develop lasting (chronic) problems with pain. If there is any problem keeping an appointment, call to reschedule. SEEK MEDICAL CARE IF:  Your chest pain does not go away, even after treatment.  You have a rash with blisters on your chest.  You have a fever. SEEK IMMEDIATE MEDICAL CARE IF:   You have increased chest pain or pain that spreads to your arm, neck, jaw, back, or abdomen.  You have shortness of breath.  You have an increasing cough, or you  cough up blood.  You have severe back or abdominal pain.  You feel nauseous or vomit.  You have severe weakness.  You faint.  You have chills. This is an emergency. Do not wait to see if the pain will go away. Get medical help at once. Call your local emergency services (911 in [place] Do not drive yourself to the hospital. MAKE SURE YOU:   Understand these instructions.  Will watch your condition.  Will get help right away if you are not doing well or get worse. Document Released: 07/27/2005 Document Revised: 10/22/2013 Document Reviewed: 05/22/2008 [facility] Patient Information 2015 [place], [place]. This information is not intended to replace advice given to you by your health care provider. Make sure you discuss any questions you have with your health care provider.

## 2014-07-08 NOTE — ED Notes (Signed)
Marcus Torres c/o central CP intermittent x 2 weeks with left jaw and neck pain. Denies SOB or diaphoresis.

## 2014-07-11 ENCOUNTER — Ambulatory Visit: Payer: BC Managed Care – PPO | Admitting: Physician Assistant

## 2014-07-14 NOTE — ED Provider Notes (Signed)
Agree with exam, assessment, and plan.   Kandra Nicolas, MD 07/14/14 [phone]

## 2014-07-18 ENCOUNTER — Ambulatory Visit (INDEPENDENT_AMBULATORY_CARE_PROVIDER_SITE_OTHER): Payer: BC Managed Care – PPO | Admitting: Family Medicine

## 2014-07-18 ENCOUNTER — Encounter: Payer: Self-pay | Admitting: Family Medicine

## 2014-07-18 ENCOUNTER — Ambulatory Visit: Payer: BC Managed Care – PPO | Admitting: Physician Assistant

## 2014-07-18 VITALS — BP 138/82 | HR 83 | Ht 74.0 in | Wt 331.0 lb

## 2014-07-18 DIAGNOSIS — M51379 Other intervertebral disc degeneration, lumbosacral region without mention of lumbar back pain or lower extremity pain: Secondary | ICD-10-CM | POA: Diagnosis not present

## 2014-07-18 DIAGNOSIS — M5136 Other intervertebral disc degeneration, lumbar region: Secondary | ICD-10-CM

## 2014-07-18 DIAGNOSIS — I10 Essential (primary) hypertension: Secondary | ICD-10-CM | POA: Diagnosis not present

## 2014-07-18 DIAGNOSIS — M5137 Other intervertebral disc degeneration, lumbosacral region: Secondary | ICD-10-CM

## 2014-07-18 MED ORDER — MELOXICAM 15 MG PO TABS: ORAL_TABLET | ORAL | Status: DC

## 2014-07-18 MED ORDER — METOPROLOL TARTRATE 50 MG PO TABS: 50.0000 mg | ORAL_TABLET | Freq: Two times a day (BID) | ORAL | Status: DC

## 2014-07-18 MED ORDER — CIPROFLOXACIN HCL 500 MG PO TABS: 500.0000 mg | ORAL_TABLET | Freq: Two times a day (BID) | ORAL | Status: DC

## 2014-07-18 MED ORDER — TIZANIDINE HCL 4 MG PO TABS: 2.0000 mg | ORAL_TABLET | Freq: Three times a day (TID) | ORAL | Status: DC | PRN

## 2014-07-18 MED ORDER — OLMESARTAN-AMLODIPINE-HCTZ 40-5-25 MG PO TABS: 1.0000 | ORAL_TABLET | Freq: Every day | ORAL | Status: DC

## 2014-07-18 NOTE — Progress Notes (Signed)
   Subjective:    Patient ID: Marcus Torres, male    DOB: 1976/10/15, 38 y.o.   MRN: [id number]  HPI Here for followup hypertension-he was seen for chest pain. He was sent to the emergency department but did rule out for any acute heart disease. He is here today to followup on blood pressure. He was restarted back on his Tribenzor and metoprolol. He says he's been taking it. He said he did miss a couple of doses on his metoprolol. He still continues to drink alcohol fairly heavily. He did have a normal stress test back in 2012 at [facility] cardiology.  He is feeling better overall.  Review of Systems     Objective:   Physical Exam  Constitutional: He is oriented to person, place, and time. He appears well-developed and well-nourished.  HENT:  Head: Normocephalic and atraumatic.  Cardiovascular: Normal rate, regular rhythm and normal heart sounds.   Pulmonary/Chest: Effort normal and breath sounds normal.  Neurological: He is alert and oriented to person, place, and time.  Skin: Skin is warm and dry.  Psychiatric: He has a normal mood and affect. His behavior is normal.          Assessment & Plan:  HTN - well controlled on repeat check.  Did have to use a large cuff to get a upper appropriate reading. I would like [place] to 6 weeks just to make sure the pressure is staying well controlled. Consider adjusting his Tribenzor dose and increasing the amlodipine component if needed. At followup I would like to recheck his lipids and triglycerides. He does have high triglycerides but with a significant alcohol intake it will likely not go back down without medication. I did send refills to pharmacy today.  BMI greater than 40-we also discussed the importance of weight loss. In the impact it has on blood pressure. Let us see him lose about 30 pounds.  Think would make a huge difference in how well his blood pressures controlled and how much medication it may take to control it. Also discussed the  importance of cutting back on his alcohol intake which is quite significant and adding to his caloric intake.

## 2014-07-18 NOTE — Patient Instructions (Signed)
Recommend smart phone application called [employer].

## 2014-08-04 ENCOUNTER — Other Ambulatory Visit: Payer: Self-pay | Admitting: Family Medicine

## 2014-08-31 ENCOUNTER — Other Ambulatory Visit: Payer: Self-pay | Admitting: Family Medicine

## 2014-09-05 ENCOUNTER — Encounter: Payer: Self-pay | Admitting: Family Medicine

## 2014-09-05 ENCOUNTER — Ambulatory Visit (INDEPENDENT_AMBULATORY_CARE_PROVIDER_SITE_OTHER): Payer: BC Managed Care – PPO | Admitting: Family Medicine

## 2014-09-05 ENCOUNTER — Other Ambulatory Visit: Payer: Self-pay | Admitting: *Deleted

## 2014-09-05 VITALS — BP 139/69 | HR 81 | Temp 98.1°F | Wt 328.0 lb

## 2014-09-05 DIAGNOSIS — J0100 Acute maxillary sinusitis, unspecified: Secondary | ICD-10-CM | POA: Diagnosis not present

## 2014-09-05 DIAGNOSIS — H0016 Chalazion left eye, unspecified eyelid: Secondary | ICD-10-CM

## 2014-09-05 MED ORDER — AMOXICILLIN-POT CLAVULANATE 875-125 MG PO TABS: 1.0000 | ORAL_TABLET | Freq: Two times a day (BID) | ORAL | Status: DC

## 2014-09-05 NOTE — Progress Notes (Signed)
   Subjective:    Patient ID: Marcus Torres, male    DOB: 1976-05-19, 38 y.o.   MRN: [id number]  HPI Sinus sxs x 1 wk has been using coricidin hbp not working.  + sick contact. Stated with a ST but that has improved and now have cough, congestion and facial pain.  Cough gags him. Cough is more wet, yelllow productive sputm.  + rinorhea.  No fever, chills or fatigue.  Mild HA.  Cough is worse at night.    Review of Systems     Objective:   Physical Exam  Constitutional: He is oriented to person, place, and time. He appears well-developed and well-nourished.  HENT:  Head: Normocephalic and atraumatic.  Right Ear: External ear normal.  Left Ear: External ear normal.  Nose: Nose normal.  Mouth/Throat: Oropharynx is clear and moist.  TMs and canals are clear.   Eyes: Conjunctivae and EOM are normal. Pupils are equal, round, and reactive to light.  Neck: Neck supple. No thyromegaly present.  Cardiovascular: Normal rate and normal heart sounds.   Pulmonary/Chest: Effort normal and breath sounds normal.  Lymphadenopathy:    He has no cervical adenopathy.  Neurological: He is alert and oriented to person, place, and time.  Skin: Skin is warm and dry.  Psychiatric: He has a normal mood and affect.          Assessment & Plan:  Acute sinusitis - sxs > 1 weeks.I will go ahead and send a perception for an antibiotic but encouraged him to give it a couple more days to see if he starts to improve on his own as he is feeling a little bit better today compared to 2 days prior. He can certainly fill the antibiotic if he continues to feel poorly or if he suddenly feels worse.  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  He would like to have it drained. It has been there for years.  Recommmend warm compresses once home.  Call if any problems.   Incision and Drainage Procedure Note  Pre-operative Diagnosis: Meibomian cyst, left lower eyelid  Post-operative Diagnosis: same  Indications:  irritaion  Anesthesia: not needed  Procedure Details  The procedure, risks and complications have been discussed in detail (including, but not limited to airway compromise, infection, bleeding) with the patient, and the patient has signed consent to the procedure.  The skin was sterilely prepped and draped over the affected area in the usual fashion. After adequate local anesthesia, I&D with a #11 blade was performed on the left lower eyelid. Purulent drainage: absent, mostly clear fluid and small amount of white discharge The patient was observed until stable.  Findings: Meibomian cyst  EBL: 0 cc's  Drains: none  Condition: Tolerated procedure well   Complications: none.

## 2014-09-28 ENCOUNTER — Other Ambulatory Visit: Payer: Self-pay | Admitting: Family Medicine

## 2014-10-26 ENCOUNTER — Other Ambulatory Visit: Payer: Self-pay | Admitting: Family Medicine

## 2014-10-31 HISTORY — PX: ARTHROSCOPIC REPAIR ACL: SUR80

## 2015-01-06 ENCOUNTER — Other Ambulatory Visit: Payer: Self-pay | Admitting: Family Medicine

## 2015-01-06 NOTE — Telephone Encounter (Signed)
F/u needed before future refills

## 2015-02-08 ENCOUNTER — Other Ambulatory Visit: Payer: Self-pay | Admitting: Family Medicine

## 2015-03-07 ENCOUNTER — Other Ambulatory Visit: Payer: Self-pay | Admitting: Family Medicine

## 2015-03-13 ENCOUNTER — Ambulatory Visit (INDEPENDENT_AMBULATORY_CARE_PROVIDER_SITE_OTHER): Payer: BLUE CROSS/BLUE SHIELD | Admitting: Family Medicine

## 2015-03-13 ENCOUNTER — Encounter: Payer: Self-pay | Admitting: Family Medicine

## 2015-03-13 VITALS — BP 124/88 | HR 80 | Wt 327.0 lb

## 2015-03-13 DIAGNOSIS — J019 Acute sinusitis, unspecified: Secondary | ICD-10-CM | POA: Diagnosis not present

## 2015-03-13 DIAGNOSIS — M5136 Other intervertebral disc degeneration, lumbar region: Secondary | ICD-10-CM | POA: Diagnosis not present

## 2015-03-13 DIAGNOSIS — I10 Essential (primary) hypertension: Secondary | ICD-10-CM | POA: Diagnosis not present

## 2015-03-13 MED ORDER — MELOXICAM 15 MG PO TABS
ORAL_TABLET | ORAL | Status: DC
Start: 2015-03-13 — End: 2015-09-15

## 2015-03-13 MED ORDER — AMOXICILLIN-POT CLAVULANATE 875-125 MG PO TABS: 1.0000 | ORAL_TABLET | Freq: Two times a day (BID) | ORAL | Status: DC

## 2015-03-13 MED ORDER — OLMESARTAN-AMLODIPINE-HCTZ 40-5-25 MG PO TABS: 1.0000 | ORAL_TABLET | Freq: Every day | ORAL | Status: DC

## 2015-03-13 NOTE — Patient Instructions (Signed)

## 2015-03-13 NOTE — Progress Notes (Signed)
   Subjective:    Patient ID: Lennox Pippins, male    DOB: 1976/05/07, 39 y.o.   MRN: [id number]  HPI Nasal congestion and cough with post nasal drip x 2.5 weeks. No fever, chills, ST or ear pain.  Thought allergies at first and tried some OTC allergy medication.     Review of Systems     Objective:   Physical Exam  Constitutional: He is oriented to person, place, and time. He appears well-developed and well-nourished.  HENT:  Head: Normocephalic and atraumatic.  Right Ear: External ear normal.  Left Ear: External ear normal.  Nose: Nose normal.  Mouth/Throat: Oropharynx is clear and moist.  TMs and canals are clear.   Eyes: Conjunctivae and EOM are normal. Pupils are equal, round, and reactive to light.  Neck: Neck supple. No thyromegaly present.  Cardiovascular: Normal rate, regular rhythm and normal heart sounds.   Pulmonary/Chest: Effort normal and breath sounds normal.  Lymphadenopathy:    He has no cervical adenopathy.  Neurological: He is alert and oriented to person, place, and time.  Skin: Skin is warm and dry.  Psychiatric: He has a normal mood and affect. His behavior is normal.          Assessment & Plan:  Acute sinusitis - Tx with augmentin. Call if not better in one week.  Ok to continue symptomatic care.

## 2015-03-14 LAB — BASIC METABOLIC PANEL WITH GFR
BUN: 16 mg/dL (ref 6–23)
CO2: 28 mEq/L (ref 19–32)
Calcium: 9.9 mg/dL (ref 8.4–10.5)
Chloride: 99 mEq/L (ref 96–112)
Creat: 0.97 mg/dL (ref 0.50–1.35)
GFR, Est African American: >89 mL/min
GFR, Est Non African American: >89 mL/min
Glucose, Bld: 92 mg/dL (ref 70–99)
Potassium: 4 mEq/L (ref 3.5–5.3)
Sodium: 137 mEq/L (ref 135–145)

## 2015-03-16 IMAGING — CR DG LUMBAR SPINE COMPLETE 4+V
5 series · 5 of 5 positions shown · non-contrast
Comparison: None.

CLINICAL DATA: Low back pain after exercise program.

EXAM:
LUMBAR SPINE - COMPLETE 4+ VIEW

[view not recorded (1 of 5)]
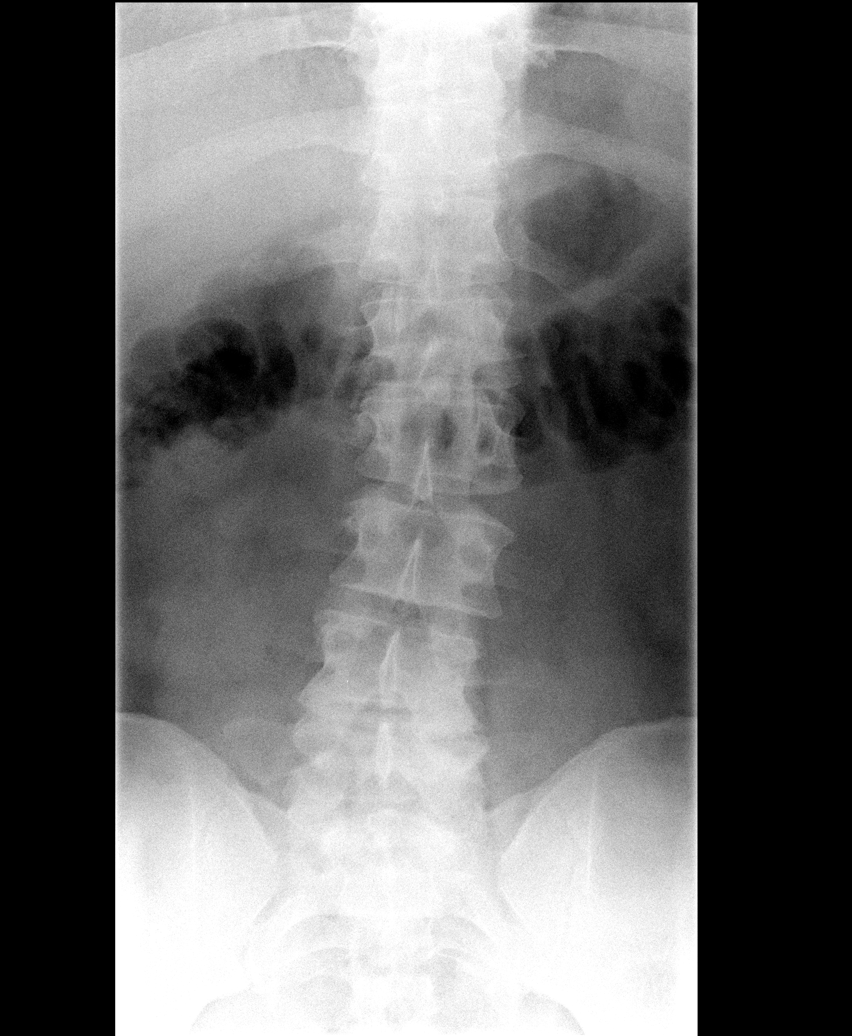

[view not recorded (2 of 5)]
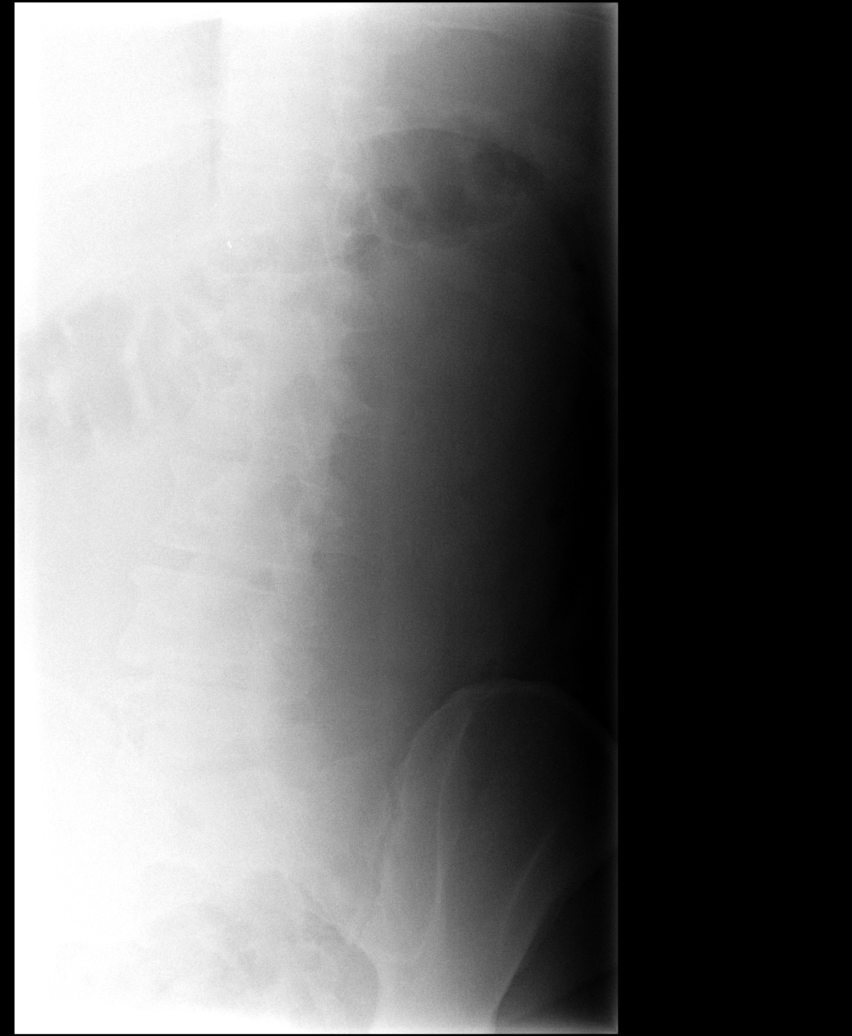

[view not recorded (3 of 5)]
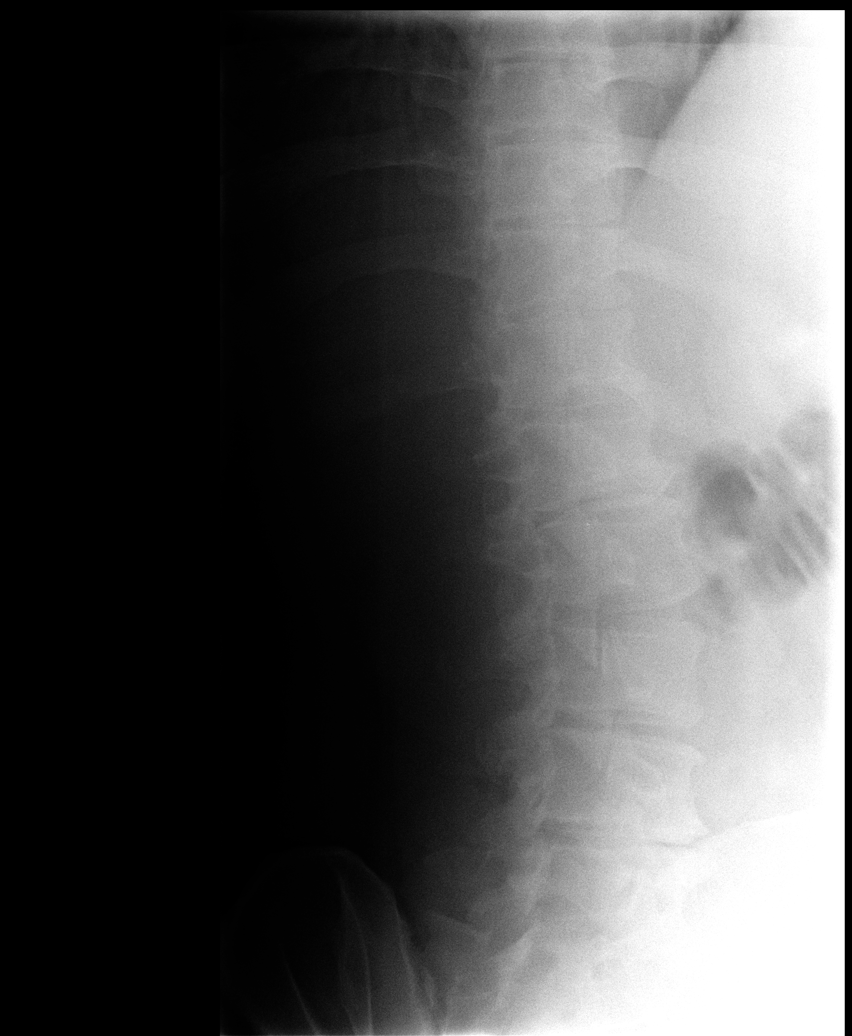

[view not recorded (4 of 5)]
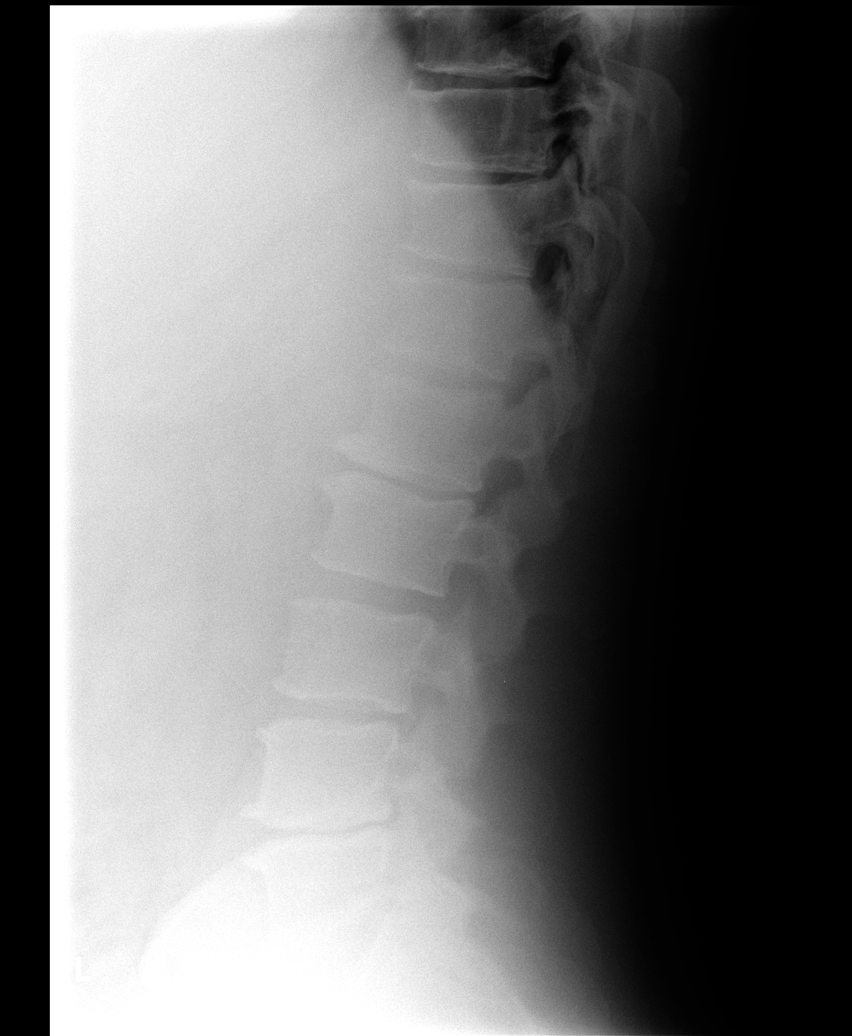

[view not recorded (5 of 5)]
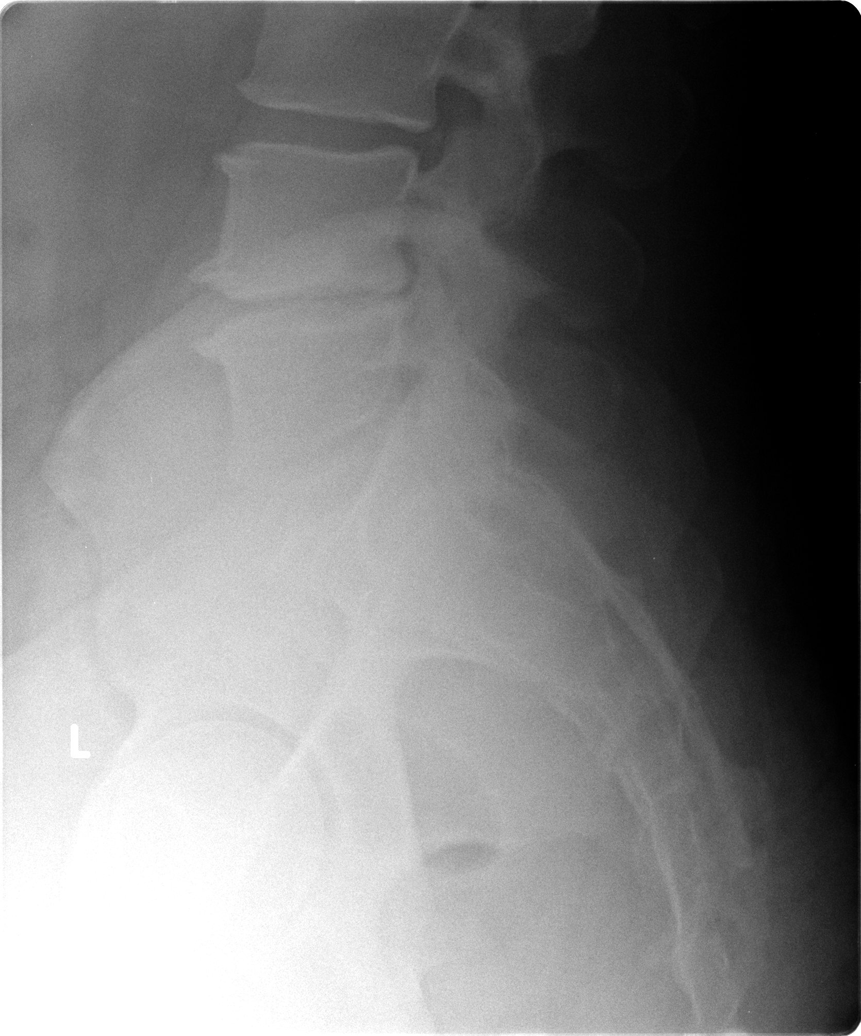

[5 of 5 positions shown; findings below may reference images not displayed]

FINDINGS: There are 5 lumbar type vertebral bodies demonstrating a mild convex
left scoliosis centered at L1-2. The lateral alignment is normal.
There is advanced disc space loss at L4-5 with endplate sclerosis
and osteophytes. Milder disc space narrowing is present at L1-2 and
L3-4. There is facet disease inferiorly. No fracture or pars defect
is demonstrated.
IMPRESSION: Lumbar spondylosis as described with disc space loss, most advanced
at L4-5. No acute osseous findings.

## 2015-03-16 NOTE — Progress Notes (Signed)
Quick Note:  All labs are normal. ______ 

## 2015-04-06 ENCOUNTER — Other Ambulatory Visit: Payer: Self-pay | Admitting: Family Medicine

## 2015-04-08 ENCOUNTER — Ambulatory Visit: Payer: BLUE CROSS/BLUE SHIELD | Admitting: Family Medicine

## 2015-05-04 ENCOUNTER — Other Ambulatory Visit: Payer: Self-pay | Admitting: Family Medicine

## 2015-05-05 NOTE — Telephone Encounter (Signed)
D/u for f/u appt

## 2015-06-05 ENCOUNTER — Other Ambulatory Visit: Payer: Self-pay | Admitting: Family Medicine

## 2015-07-09 ENCOUNTER — Other Ambulatory Visit: Payer: Self-pay | Admitting: Family Medicine

## 2015-07-12 DIAGNOSIS — S83241D Other tear of medial meniscus, current injury, right knee, subsequent encounter: Secondary | ICD-10-CM | POA: Insufficient documentation

## 2015-07-12 DIAGNOSIS — S83511A Sprain of anterior cruciate ligament of right knee, initial encounter: Secondary | ICD-10-CM | POA: Insufficient documentation

## 2015-08-09 ENCOUNTER — Other Ambulatory Visit: Payer: Self-pay | Admitting: Family Medicine

## 2015-08-11 ENCOUNTER — Other Ambulatory Visit: Payer: Self-pay | Admitting: Family Medicine

## 2015-09-15 ENCOUNTER — Ambulatory Visit (INDEPENDENT_AMBULATORY_CARE_PROVIDER_SITE_OTHER): Payer: BLUE CROSS/BLUE SHIELD | Admitting: Family Medicine

## 2015-09-15 ENCOUNTER — Encounter: Payer: Self-pay | Admitting: Family Medicine

## 2015-09-15 VITALS — BP 126/70 | HR 75 | Wt 335.0 lb

## 2015-09-15 DIAGNOSIS — I10 Essential (primary) hypertension: Secondary | ICD-10-CM

## 2015-09-15 DIAGNOSIS — E785 Hyperlipidemia, unspecified: Secondary | ICD-10-CM | POA: Diagnosis not present

## 2015-09-15 DIAGNOSIS — J029 Acute pharyngitis, unspecified: Secondary | ICD-10-CM | POA: Diagnosis not present

## 2015-09-15 DIAGNOSIS — Z114 Encounter for screening for human immunodeficiency virus [HIV]: Secondary | ICD-10-CM

## 2015-09-15 MED ORDER — NITROGLYCERIN 0.4 MG SL SUBL: 0.4000 mg | SUBLINGUAL_TABLET | SUBLINGUAL | Status: DC | PRN

## 2015-09-15 MED ORDER — METOPROLOL TARTRATE 50 MG PO TABS: ORAL_TABLET | ORAL | Status: DC

## 2015-09-15 MED ORDER — OLMESARTAN-AMLODIPINE-HCTZ 40-5-25 MG PO TABS: 1.0000 | ORAL_TABLET | Freq: Every day | ORAL | Status: DC

## 2015-09-15 NOTE — Progress Notes (Signed)
   Subjective:    Patient ID: Marcus Torres, male    DOB: 03/26/1976, 39 y.o.   MRN: [id number]  HPI Hypertension- Pt denies chest pain, SOB, dizziness, or heart palpitations.  Taking meds as directed w/o problems.  Denies medication side effects.    He does have a sore throat that started last night. His son is currently on his buttocks for an ear infection. They think he may have strep but they did not actually test him since they were going to put him on antibiotic anyway. He want me to take a look at his throat.no fevers chills or sweats or other upper respiratory symptoms.  Review of Systems     Objective:   Physical Exam  Constitutional: He is oriented to person, place, and time. He appears well-developed and well-nourished.  HENT:  Head: Normocephalic and atraumatic.  Nose: Nose normal.  Mouth/Throat: Oropharynx is clear and moist.  Eyes: Conjunctivae and EOM are normal. Pupils are equal, round, and reactive to light. Right eye exhibits no discharge. Left eye exhibits no discharge.  Neck: Neck supple. No thyromegaly present.  Cardiovascular: Normal rate, regular rhythm and normal heart sounds.   Pulmonary/Chest: Effort normal and breath sounds normal.  Lymphadenopathy:    He has no cervical adenopathy.  Neurological: He is alert and oriented to person, place, and time.  Skin: Skin is warm and dry.  Psychiatric: He has a normal mood and affect. His behavior is normal.          Assessment & Plan:  HTN - well-controlled. Continue current regimen. Due for CMP and fasting lipid panel. All up in 6 months. Next  Sore throat-likely viral. Exam is benign.Return if getting worse in the next few days worth develops fever.

## 2015-11-04 LAB — COMPLETE METABOLIC PANEL WITH GFR
ALT: 172 U/L — ABNORMAL HIGH (ref 9–46)
AST: 264 U/L — ABNORMAL HIGH (ref 10–40)
Albumin: 4.5 g/dL (ref 3.6–5.1)
Alkaline Phosphatase: 89 U/L (ref 40–115)
BUN: 17 mg/dL (ref 7–25)
CO2: 24 mmol/L (ref 20–31)
Calcium: 9.4 mg/dL (ref 8.6–10.3)
Chloride: 98 mmol/L (ref 98–110)
Creat: 1.06 mg/dL (ref 0.60–1.35)
GFR, Est African American: >89 mL/min (ref >=60)
GFR, Est Non African American: 87 mL/min (ref >=60)
Glucose, Bld: 138 mg/dL — ABNORMAL HIGH (ref 65–99)
Potassium: 4.3 mmol/L (ref 3.5–5.3)
Sodium: 137 mmol/L (ref 135–146)
Total Bilirubin: 1 mg/dL (ref 0.2–1.2)
Total Protein: 8 g/dL (ref 6.1–8.1)

## 2015-11-04 LAB — LIPID PANEL
Cholesterol: 318 mg/dL — ABNORMAL HIGH (ref 125–200)
HDL: 49 mg/dL (ref >=40)
Total CHOL/HDL Ratio: 6.5 Ratio — ABNORMAL HIGH (ref <=5.0)
Triglycerides: 597 mg/dL — ABNORMAL HIGH (ref <150)

## 2015-11-04 LAB — HIV ANTIBODY (ROUTINE TESTING W REFLEX): HIV 1&2 Ab, 4th Generation: NONREACTIVE

## 2015-11-20 ENCOUNTER — Encounter: Payer: Self-pay | Admitting: Family Medicine

## 2016-04-07 ENCOUNTER — Encounter: Payer: Self-pay | Admitting: Family Medicine

## 2016-04-07 ENCOUNTER — Ambulatory Visit (INDEPENDENT_AMBULATORY_CARE_PROVIDER_SITE_OTHER): Payer: BLUE CROSS/BLUE SHIELD | Admitting: Family Medicine

## 2016-04-07 VITALS — BP 133/76 | HR 75 | Wt 357.0 lb

## 2016-04-07 DIAGNOSIS — S39012A Strain of muscle, fascia and tendon of lower back, initial encounter: Secondary | ICD-10-CM | POA: Insufficient documentation

## 2016-04-07 DIAGNOSIS — S338XXA Sprain of other parts of lumbar spine and pelvis, initial encounter: Secondary | ICD-10-CM

## 2016-04-07 MED ORDER — CYCLOBENZAPRINE HCL 10 MG PO TABS: 10.0000 mg | ORAL_TABLET | Freq: Three times a day (TID) | ORAL | Status: DC | PRN

## 2016-04-07 NOTE — Patient Instructions (Signed)
Thank you for coming in today. Come back or go to the emergency room if you notice new weakness new numbness problems walking or bowel or bladder problems. Attend PT.  Take flexeril at bedtime.   TENS UNIT: This is helpful for muscle pain and spasm.   Search and Purchase a TENS 7000 2nd edition at www.tenspros.com. It should be less than $30.     TENS unit instructions: Do not shower or bathe with the unit on Turn the unit off before removing electrodes or batteries If the electrodes lose stickiness add a drop of water to the electrodes after they are disconnected from the unit and place on plastic sheet. If you continued to have difficulty, call the TENS unit company to purchase more electrodes. Do not apply lotion on the skin area prior to use. Make sure the skin is clean and dry as this will help prolong the life of the electrodes. After use, always check skin for unusual red areas, rash or other skin difficulties. If there are any skin problems, does not apply electrodes to the same area. Never remove the electrodes from the unit by pulling the wires. Do not use the TENS unit or electrodes other than as directed. Do not change electrode placement without consultating your therapist or physician. Keep 2 fingers with between each electrode. Wear time ratio is 2:1, on to off times.    For example on for 30 minutes off for 15 minutes and then on for 30 minutes off for 15 minutes  Lumbosacral Strain Lumbosacral strain is a strain of any of the parts that make up your lumbosacral vertebrae. Your lumbosacral vertebrae are the bones that make up the lower third of your backbone. Your lumbosacral vertebrae are held together by muscles and tough, fibrous tissue (ligaments).  CAUSES  A sudden blow to your back can cause lumbosacral strain. Also, anything that causes an excessive stretch of the muscles in the low back can cause this strain. This is typically seen when people exert themselves  strenuously, fall, lift heavy objects, bend, or crouch repeatedly. RISK FACTORS  Physically demanding work.  Participation in pushing or pulling sports or sports that require a sudden twist of the back (tennis, golf, baseball).  Weight lifting.  Excessive lower back curvature.  Forward-tilted pelvis.  Weak back or abdominal muscles or both.  Tight hamstrings. SIGNS AND SYMPTOMS  Lumbosacral strain may cause pain in the area of your injury or pain that moves (radiates) down your leg.  DIAGNOSIS Your health care provider can often diagnose lumbosacral strain through a physical exam. In some cases, you may need tests such as X-ray exams.  TREATMENT  Treatment for your lower back injury depends on many factors that your clinician will have to evaluate. However, most treatment will include the use of anti-inflammatory medicines. HOME CARE INSTRUCTIONS   Avoid hard physical activities (tennis, racquetball, waterskiing) if you are not in proper physical condition for it. This may aggravate or create problems.  If you have a back problem, avoid sports requiring sudden body movements. Swimming and walking are generally safer activities.  Maintain good posture.  Maintain a healthy weight.  For acute conditions, you may put ice on the injured area.  Put ice in a plastic bag.  Place a towel between your skin and the bag.  Leave the ice on for 20 minutes, 2-3 times a day.  When the low back starts healing, stretching and strengthening exercises may be recommended. SEEK MEDICAL CARE IF:  Your back pain is getting worse.  You experience severe back pain not relieved with medicines. SEEK IMMEDIATE MEDICAL CARE IF:   You have numbness, tingling, weakness, or problems with the use of your arms or legs.  There is a change in bowel or bladder control.  You have increasing pain in any area of the body, including your belly (abdomen).  You notice shortness of breath, dizziness, or  feel faint.  You feel sick to your stomach (nauseous), are throwing up (vomiting), or become sweaty.  You notice discoloration of your toes or legs, or your feet get very cold. MAKE SURE YOU:   Understand these instructions.  Will watch your condition.  Will get help right away if you are not doing well or get worse.   This information is not intended to replace advice given to you by your health care provider. Make sure you discuss any questions you have with your health care provider.   Document Released: 07/27/2005 Document Revised: 11/07/2014 Document Reviewed: 06/05/2013 Elsevier Interactive Patient Education [employer].

## 2016-04-07 NOTE — Progress Notes (Signed)
   Subjective:    I'm seeing this patient as a consultation for:  Dr. Suzi Roots  CC: Back pain  HPI: Patient notes a one-day history of severe low back pain. He bent down to pick an object off the ground and when he stood up developed severe right-sided low back pain. He denies any radiating pain weakness or numbness fevers or chills. No injury. No bowel bladder dysfunction or difficulty walking. He's tried some over-the-counter medicines which did not help much. He notes that his symptoms are not consistent with previous episodes of kidney stones.  Past medical history, Surgical history, Family history not pertinant except as noted below, Social history, Allergies, and medications have been entered into the medical record, reviewed, and no changes needed.   Review of Systems: No headache, visual changes, nausea, vomiting, diarrhea, constipation, dizziness, abdominal pain, skin rash, fevers, chills, night sweats, weight loss, swollen lymph nodes, body aches, joint swelling, muscle aches, chest pain, shortness of breath, mood changes, visual or auditory hallucinations.   Objective:    Filed Vitals:   04/07/16 1059  BP: 133/76  Pulse: 75   General: Well Developed, well nourished, and in no acute distress.  Neuro/Psych: Alert and oriented x3, extra-ocular muscles intact, able to move all 4 extremities, sensation grossly intact. Skin: Warm and dry, no rashes noted.  Respiratory: Not using accessory muscles, speaking in full sentences, trachea midline.  Cardiovascular: Pulses palpable, no extremity edema. Abdomen: Does not appear distended. MSK: Back: Nontender to spinal midline. Tender palpation right lumbar paraspinal muscle group. Normal extension without significant pain. Normal rotation and lateral flexion. Decreased flexion due to pain. Lower extremity strength is equal and normal throughout.  Negative slump test.    No results found for this or any previous visit (from the past 24  hour(s)). No results found.  Impression and Recommendations:   40 year old male with right-sided lumbosacral strain. Treat with naproxen and Flexeril physical therapy TENS unit and heating pad. Recheck in 2-4 weeks if not better.  This case required medical decision making of moderate complexity.

## 2016-04-14 ENCOUNTER — Other Ambulatory Visit: Payer: Self-pay | Admitting: Family Medicine

## 2016-05-11 ENCOUNTER — Other Ambulatory Visit: Payer: Self-pay | Admitting: Family Medicine

## 2016-05-17 ENCOUNTER — Telehealth: Payer: Self-pay | Admitting: Family Medicine

## 2016-05-17 NOTE — Telephone Encounter (Signed)
Called pt and lv to let him know his is past due for his fu for bp. Thanks

## 2016-05-20 ENCOUNTER — Other Ambulatory Visit: Payer: Self-pay

## 2016-05-20 DIAGNOSIS — I10 Essential (primary) hypertension: Secondary | ICD-10-CM

## 2016-05-20 MED ORDER — OLMESARTAN-AMLODIPINE-HCTZ 40-5-25 MG PO TABS: 1.0000 | ORAL_TABLET | Freq: Every day | ORAL | Status: DC

## 2016-05-20 NOTE — Progress Notes (Signed)
Spoke with wife, Pt has appt scheduled. Will send one month supply, advised he has to keep appointment.

## 2016-05-27 ENCOUNTER — Encounter: Payer: Self-pay | Admitting: Family Medicine

## 2016-05-27 ENCOUNTER — Ambulatory Visit (INDEPENDENT_AMBULATORY_CARE_PROVIDER_SITE_OTHER): Payer: BLUE CROSS/BLUE SHIELD | Admitting: Family Medicine

## 2016-05-27 DIAGNOSIS — I10 Essential (primary) hypertension: Secondary | ICD-10-CM | POA: Diagnosis not present

## 2016-05-27 MED ORDER — TRIBENZOR 40-5-25 MG PO TABS: 1.0000 | ORAL_TABLET | Freq: Every day | ORAL | 9 refills | Status: DC

## 2016-05-27 MED ORDER — METOPROLOL TARTRATE 50 MG PO TABS: ORAL_TABLET | ORAL | 9 refills | Status: DC

## 2016-05-27 NOTE — Progress Notes (Signed)
Subjective:    CC: BP  HPI:  Hypertension- Pt denies chest pain, SOB, dizziness, or heart palpitations.  Taking meds as directed w/o problems.  Denies medication side effects.  Prefers brand [employer].    Past medical history, Surgical history, Family history not pertinant except as noted below, Social history, Allergies, and medications have been entered into the medical record, reviewed, and corrections made.   Review of Systems: No fevers, chills, night sweats, weight loss, chest pain, or shortness of breath.   Objective:    General: Well Developed, well nourished, and in no acute distress.  Neuro: Alert and oriented x3, extra-ocular muscles intact, sensation grossly intact.  HEENT: Normocephalic, atraumatic  Skin: Warm and dry, no rashes. Cardiac: Regular rate and rhythm, no murmurs rubs or gallops, no lower extremity edema.  Respiratory: Clear to auscultation bilaterally. Not using accessory muscles, speaking in full sentences.   Impression and Recommendations:   HTN  - well controlled.  F/U in 6 mo.  Check BMP.

## 2016-06-15 ENCOUNTER — Other Ambulatory Visit: Payer: Self-pay | Admitting: Family Medicine

## 2016-06-15 DIAGNOSIS — I10 Essential (primary) hypertension: Secondary | ICD-10-CM

## 2016-10-21 DIAGNOSIS — N179 Acute kidney failure, unspecified: Secondary | ICD-10-CM | POA: Insufficient documentation

## 2016-10-21 DIAGNOSIS — J189 Pneumonia, unspecified organism: Secondary | ICD-10-CM | POA: Insufficient documentation

## 2016-10-28 DIAGNOSIS — D72810 Lymphocytopenia: Secondary | ICD-10-CM | POA: Insufficient documentation

## 2016-10-28 DIAGNOSIS — E871 Hypo-osmolality and hyponatremia: Secondary | ICD-10-CM | POA: Insufficient documentation

## 2016-10-28 DIAGNOSIS — R162 Hepatomegaly with splenomegaly, not elsewhere classified: Secondary | ICD-10-CM | POA: Insufficient documentation

## 2016-10-28 DIAGNOSIS — R7989 Other specified abnormal findings of blood chemistry: Secondary | ICD-10-CM | POA: Insufficient documentation

## 2016-10-28 DIAGNOSIS — N183 Chronic kidney disease, stage 3 unspecified: Secondary | ICD-10-CM | POA: Insufficient documentation

## 2016-10-28 DIAGNOSIS — D500 Iron deficiency anemia secondary to blood loss (chronic): Secondary | ICD-10-CM | POA: Insufficient documentation

## 2016-10-28 DIAGNOSIS — R945 Abnormal results of liver function studies: Secondary | ICD-10-CM

## 2016-10-28 DIAGNOSIS — R590 Localized enlarged lymph nodes: Secondary | ICD-10-CM | POA: Insufficient documentation

## 2016-10-28 DIAGNOSIS — K922 Gastrointestinal hemorrhage, unspecified: Secondary | ICD-10-CM | POA: Insufficient documentation

## 2017-01-10 ENCOUNTER — Other Ambulatory Visit: Payer: Self-pay | Admitting: Family Medicine

## 2017-01-10 DIAGNOSIS — I10 Essential (primary) hypertension: Secondary | ICD-10-CM

## 2017-02-20 ENCOUNTER — Ambulatory Visit: Payer: BLUE CROSS/BLUE SHIELD | Admitting: Family Medicine

## 2017-02-28 ENCOUNTER — Ambulatory Visit: Payer: BLUE CROSS/BLUE SHIELD | Admitting: Family Medicine

## 2017-03-02 ENCOUNTER — Ambulatory Visit (INDEPENDENT_AMBULATORY_CARE_PROVIDER_SITE_OTHER): Payer: BLUE CROSS/BLUE SHIELD | Admitting: Family Medicine

## 2017-03-02 VITALS — BP 140/80 | HR 80 | Ht 74.0 in | Wt 362.0 lb

## 2017-03-02 DIAGNOSIS — Z6841 Body Mass Index (BMI) 40.0 and over, adult: Secondary | ICD-10-CM | POA: Diagnosis not present

## 2017-03-02 DIAGNOSIS — I10 Essential (primary) hypertension: Secondary | ICD-10-CM | POA: Diagnosis not present

## 2017-03-02 MED ORDER — TRIBENZOR 40-5-25 MG PO TABS: 1.0000 | ORAL_TABLET | Freq: Every day | ORAL | 1 refills | Status: DC

## 2017-03-02 NOTE — Progress Notes (Signed)
   Subjective:    Patient ID: Marcus Torres, male    DOB: 1976-01-13, 41 y.o.   MRN: [id number]  HPI Hypertension- Pt denies chest pain, SOB, dizziness, or heart palpitations.  Taking meds as directed w/o problems.  Denies medication side effects.  He actually reports is been out of his medication for about 2 weeks but did take 1 today. He has also gained some weight. He's not been working out as regularly. That he has to gym membership.  Wt Readings from Last 3 Encounters:  03/02/17 (!) 362 lb (164.2 kg)  05/27/16 (!) 361 lb (163.7 kg)  04/07/16 (!) 357 lb (161.9 kg)      Review of Systems     Objective:   Physical Exam  Constitutional: He is oriented to person, place, and time. He appears well-developed and well-nourished.  HENT:  Head: Normocephalic and atraumatic.  Cardiovascular: Normal rate, regular rhythm and normal heart sounds.   Pulmonary/Chest: Effort normal and breath sounds normal.  Neurological: He is alert and oriented to person, place, and time.  Skin: Skin is warm and dry.  Psychiatric: He has a normal mood and affect. His behavior is normal.          Assessment & Plan:  HTN - Uncontrolled today. We'll get him back on his medication. Follow-up for nurse visit in 2 weeks. We had a long discussion about getting back on track with diet and exercise. Next  BMI greater than 40-discussed the importance of weight loss. His weight is been gradually keep creeping up over the last couple of years and we discussed getting back on track.

## 2017-03-15 ENCOUNTER — Ambulatory Visit: Payer: BLUE CROSS/BLUE SHIELD

## 2017-03-24 ENCOUNTER — Ambulatory Visit: Payer: BLUE CROSS/BLUE SHIELD

## 2017-04-12 ENCOUNTER — Other Ambulatory Visit: Payer: Self-pay | Admitting: Family Medicine

## 2017-04-12 DIAGNOSIS — I10 Essential (primary) hypertension: Secondary | ICD-10-CM

## 2017-08-31 ENCOUNTER — Other Ambulatory Visit: Payer: Self-pay | Admitting: Family Medicine

## 2017-08-31 DIAGNOSIS — I10 Essential (primary) hypertension: Secondary | ICD-10-CM

## 2017-09-11 DIAGNOSIS — G4733 Obstructive sleep apnea (adult) (pediatric): Secondary | ICD-10-CM | POA: Insufficient documentation

## 2017-09-19 ENCOUNTER — Ambulatory Visit (INDEPENDENT_AMBULATORY_CARE_PROVIDER_SITE_OTHER): Payer: BLUE CROSS/BLUE SHIELD | Admitting: Family Medicine

## 2017-09-19 ENCOUNTER — Encounter: Payer: Self-pay | Admitting: Family Medicine

## 2017-09-19 ENCOUNTER — Telehealth: Payer: Self-pay | Admitting: Emergency Medicine

## 2017-09-19 VITALS — BP 135/62 | HR 77 | Ht 74.0 in | Wt 352.0 lb

## 2017-09-19 DIAGNOSIS — K7010 Alcoholic hepatitis without ascites: Secondary | ICD-10-CM

## 2017-09-19 DIAGNOSIS — F10288 Alcohol dependence with other alcohol-induced disorder: Secondary | ICD-10-CM

## 2017-09-19 DIAGNOSIS — R748 Abnormal levels of other serum enzymes: Secondary | ICD-10-CM | POA: Diagnosis not present

## 2017-09-19 DIAGNOSIS — E871 Hypo-osmolality and hyponatremia: Secondary | ICD-10-CM | POA: Diagnosis not present

## 2017-09-19 DIAGNOSIS — D126 Benign neoplasm of colon, unspecified: Secondary | ICD-10-CM | POA: Diagnosis not present

## 2017-09-19 DIAGNOSIS — R0681 Apnea, not elsewhere classified: Secondary | ICD-10-CM | POA: Diagnosis not present

## 2017-09-19 DIAGNOSIS — R161 Splenomegaly, not elsewhere classified: Secondary | ICD-10-CM

## 2017-09-19 NOTE — Progress Notes (Signed)
Subjective:    Patient ID: Marcus Torres, male    DOB: Feb 02, 1976, 41 y.o.   MRN: [id number]  HPI   Marcus Torres is a 41 year old male here is here today to follow-up for recent hospitalization.  He was admitted to [facility] on November 6 and discharged on November 12.  Please see hospital course note below.  "Hospital Course: Indication for Admission/chief complaint: Patient presented to the ED requesting detox from alcohol as his alcohol use has reached such a level it is interfering with his ability to adequately perform his job.  41 year old obese male with history significant for alcohol abuse, presented seeking help for his alcohol use. He drinks about a fifth a day and has been doing so for about 15 years now. At times he may even drink more . He reported that lately his alcohol use seems to be interfering with his ability to perform his work effectively and he presents requesting help. He has never been through any substance abuse rehab . On presentation he is noted to be profoundly hyponatremic with a sodium of 108. His last alcohol intake was the night prior to presentation to the ED Hospital Course:  Profound hyponatremia : Multifactorial in aetiology to include use of HCTZ, alcohol use, SIADH and dehydration.  He was managed with IVF hydration, and 2 doses of tolvaptan ensuring that his sodium level does not rapidly rise by more than 8-41meq/day . He was commenced on salt tablets when his level reached 127 and currently at 132.Recommended to recheck BMP on follow up visit with his PCP within a week of discharge   Alcohol abuse : He was seen by the substance abuse counselor as well as psychiatry . He desires to undergo intensive outpatient therapy for his alcohol use. This has been set up by psychiatry and he will begin on 09/12/2017 . He was managed with scheduled librium taper and CIWA protocol with ativan . He did quite well on this regimen .   Suspected sleep apnea. While in the ICU, it  was noted that his oxygen level will drop in the night when sleeping. An apnea link testing showed a very high probability of sleep apnea, he has been advised that he needs to undergo a polysomnogram as an outpatient on discharge . This has been set up with [facility]   Rectal bleeding with acute blood loss anemia : He had a bloody stool on admission and he reported that this has been going on since Saturday of the week prior to presentation .[facility]HS was consulted and he had a colonoscopy with findings of some polyps which were snared. He will follow up with [facility] for results on these polyps   Recommendations to physicians/followup needed: Recheck BMP on follow up visit "   At times he reports he still feels a little short of breath particularly with walking and activity.  And feels a little anxious.  In regards to the hyponatremia-he is currently on 3 tabs of salt daily.  Review of Systems   BP 135/62   Pulse 77   Ht 6\' 2"  (1.88 m)   Wt (!) 352 lb (159.7 kg)   SpO2 97%   BMI 45.19 kg/m     Allergies  Allergen Reactions  . Lisinopril-Hydrochlorothiazide     REACTION: cough    Past Medical History:  Diagnosis Date  . Alcohol abuse   . Elevated liver enzymes   . Hypertension   . Normal cardiac stress test  05/09/2011   Negative treadmill performed at [facility] cardiology    Past Surgical History:  Procedure Laterality Date  . ARTHROSCOPIC REPAIR ACL  2016   Dr. Annie Main Pill     Social History   Socioeconomic History  . Marital status: Married    Spouse name: Not on file  . Number of children: Not on file  . Years of education: Not on file  . Highest education level: Not on file  Social Needs  . Financial resource strain: Not on file  . Food insecurity - worry: Not on file  . Food insecurity - inability: Not on file  . Transportation needs - medical: Not on file  . Transportation needs - non-medical: Not on file  Occupational History  . Not on file  Tobacco  Use  . Smoking status: Current Every Day Smoker    Packs/day: 0.25    Types: Cigarettes  . Smokeless tobacco: Never Used  Substance and Sexual Activity  . Alcohol use: Yes    Alcohol/week: 14.0 oz    Types: 28 drink(s) per week    Comment: per week  . Drug use: No  . 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 Not on file  Social History Narrative  . Not on file    Family History  Problem Relation Age of Onset  . Hypertension Mother   . Hyperlipidemia Mother   . Other Other        Cardiac stent    Outpatient Encounter Medications as of 09/19/2017  Medication Sig  . amLODipine (NORVASC) 5 MG tablet Take 5 mg by mouth daily.  . folic acid (FOLVITE) 1 MG tablet Take 1 mg by mouth daily.  . metoprolol tartrate (LOPRESSOR) 50 MG tablet TAKE ONE TABLET BY MOUTH TWO TIMES A DAY  . Multiple Vitamin (THERA) TABS Take 1 tablet by mouth daily.  . nitroGLYCERIN (NITROSTAT) 0.4 MG SL tablet Place 1 tablet (0.4 mg total) under the tongue every 5 (five) minutes as needed for chest pain (or tightness).  . pantoprazole (PROTONIX) 40 MG tablet Take 40 mg by mouth daily.  . sodium chloride 1 g tablet Take 1 g by mouth 3 (three) times daily.  Marland Kitchen thiamine 100 MG tablet Take 100 mg by mouth daily.  . [DISCONTINUED] TRIBENZOR 40-5-25 MG TABS Take 1 tablet by mouth daily. MUST SCHEDULE AN APPOINTMENT FOR FUTURE REFILLS. PLEASE CALL OFFICE TO SCHEDULE.   No facility-administered encounter medications on file as of 09/19/2017.          Objective:   Physical Exam  Constitutional: He is oriented to person, place, and time. He appears well-developed and well-nourished.  HENT:  Head: Normocephalic and atraumatic.  Cardiovascular: Normal rate, regular rhythm and normal heart sounds.  Pulmonary/Chest: Effort normal and breath sounds normal.  Neurological: He is alert and oriented to person, place, and time.  Skin: Skin is warm and dry.  Psychiatric: He has a normal  mood and affect. His behavior is normal.       Assessment & Plan:  Severe alcohol dependence -he has been sober for 15 days but has not called to arrange getting into an outpatient program.  Strongly encouraged him to do so today.  Hyponatremia -due to recheck labs.  Currently on 3 tabs daily.  Can adjust dose if needed.  Apnea -is actually still scheduled for his sleep study tonight.  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  Recommend  follow-up with GI in 1 year.  Splenomegaly-ultrasound from hospitalization shows that the spleen measured 20.6 cm in size.  Complete disability form for work for his absence.

## 2017-09-19 NOTE — Telephone Encounter (Signed)
Requesting release to return to work on 09/25/17. He will pick this up tomorrow when he brings in more papers for disability; I told him letter would be at front desk. pk

## 2017-09-20 LAB — HEPATIC FUNCTION PANEL
AG Ratio: 1.2 (calc) (ref 1.0–2.5)
ALT: 64 U/L — ABNORMAL HIGH (ref 9–46)
AST: 142 U/L — ABNORMAL HIGH (ref 10–40)
Albumin: 4.2 g/dL (ref 3.6–5.1)
Alkaline phosphatase (APISO): 219 U/L — ABNORMAL HIGH (ref 40–115)
Bilirubin, Direct: 0.6 mg/dL — ABNORMAL HIGH (ref 0.0–0.2)
Globulin: 3.4 g/dL (calc) (ref 1.9–3.7)
Indirect Bilirubin: 0.7 mg/dL (calc) (ref 0.2–1.2)
Total Bilirubin: 1.3 mg/dL — ABNORMAL HIGH (ref 0.2–1.2)
Total Protein: 7.6 g/dL (ref 6.1–8.1)

## 2017-09-20 LAB — BASIC METABOLIC PANEL WITH GFR
BUN: 11 mg/dL (ref 7–25)
CO2: 22 mmol/L (ref 20–32)
Calcium: 9.6 mg/dL (ref 8.6–10.3)
Chloride: 104 mmol/L (ref 98–110)
Creat: 0.95 mg/dL (ref 0.60–1.35)
GFR, Est African American: 114 mL/min/{1.73_m2} (ref >=60)
GFR, Est Non African American: 98 mL/min/{1.73_m2} (ref >=60)
Glucose, Bld: 85 mg/dL (ref 65–99)
Potassium: 4.3 mmol/L (ref 3.5–5.3)
Sodium: 136 mmol/L (ref 135–146)

## 2017-09-25 ENCOUNTER — Inpatient Hospital Stay: Payer: BLUE CROSS/BLUE SHIELD | Admitting: Family Medicine

## 2017-09-25 ENCOUNTER — Telehealth: Payer: Self-pay | Admitting: Family Medicine

## 2017-09-25 ENCOUNTER — Encounter: Payer: Self-pay | Admitting: Family Medicine

## 2017-09-25 NOTE — Telephone Encounter (Signed)
Pt picked up letter.

## 2017-09-25 NOTE — Telephone Encounter (Signed)
Please call patient: I did get a copy of his sleep study that was performed at [facility].  His respiratory disturbance index was 135 events per hour and the lowest oxygen that he dropped to was 63%.  The average is around 90%.  As he knows the place CPAP on him in the middle the night and recommended a pressure between 17-19 cm of water pressure.  I would like to go ahead and get him started on CPAP.  We can order the device to be delivered to his home and then I would like to see him back after 1 month if he is okay with this then please let me know and we will place the orders.

## 2017-09-25 NOTE — Telephone Encounter (Signed)
Okay for letter to return to work today.

## 2017-09-25 NOTE — Telephone Encounter (Signed)
Letter written

## 2017-09-25 NOTE — Telephone Encounter (Signed)
Pt called and spoke with Cecilio Asper on 11/20 . He is needing a letter to return to work today. Thanks.

## 2017-09-26 MED ORDER — AMBULATORY NON FORMULARY MEDICATION: 0 refills | Status: AC

## 2017-09-26 NOTE — Telephone Encounter (Signed)
Pt advised of results and recommendations and would like to proceed with having the cpap order.Marcus Torres    Order placed.Marcus Torres'

## 2017-09-28 ENCOUNTER — Telehealth: Payer: Self-pay

## 2017-09-28 NOTE — Telephone Encounter (Signed)
OK 

## 2017-09-28 NOTE — Telephone Encounter (Signed)
Patient would like to cancel his CPAP order. He will get the CPAP through his sleep specialist.

## 2017-10-08 ENCOUNTER — Other Ambulatory Visit: Payer: Self-pay | Admitting: Family Medicine

## 2017-10-08 DIAGNOSIS — I10 Essential (primary) hypertension: Secondary | ICD-10-CM

## 2017-10-17 ENCOUNTER — Ambulatory Visit (INDEPENDENT_AMBULATORY_CARE_PROVIDER_SITE_OTHER): Payer: BLUE CROSS/BLUE SHIELD | Admitting: Family Medicine

## 2017-10-17 VITALS — BP 139/69 | HR 70 | Ht 74.0 in | Wt 344.0 lb

## 2017-10-17 DIAGNOSIS — G4733 Obstructive sleep apnea (adult) (pediatric): Secondary | ICD-10-CM

## 2017-10-17 DIAGNOSIS — E871 Hypo-osmolality and hyponatremia: Secondary | ICD-10-CM | POA: Diagnosis not present

## 2017-10-17 DIAGNOSIS — M25471 Effusion, right ankle: Secondary | ICD-10-CM

## 2017-10-17 DIAGNOSIS — M25472 Effusion, left ankle: Secondary | ICD-10-CM

## 2017-10-17 DIAGNOSIS — K7010 Alcoholic hepatitis without ascites: Secondary | ICD-10-CM | POA: Diagnosis not present

## 2017-10-17 LAB — BASIC METABOLIC PANEL WITH GFR
BUN: 9 mg/dL (ref 7–25)
CO2: 24 mmol/L (ref 20–32)
Calcium: 10 mg/dL (ref 8.6–10.3)
Chloride: 105 mmol/L (ref 98–110)
Creat: 1.02 mg/dL (ref 0.60–1.35)
GFR, Est African American: 105 mL/min/{1.73_m2} (ref >=60)
GFR, Est Non African American: 90 mL/min/{1.73_m2} (ref >=60)
Glucose, Bld: 93 mg/dL (ref 65–99)
Potassium: 3.9 mmol/L (ref 3.5–5.3)
Sodium: 139 mmol/L (ref 135–146)

## 2017-10-17 NOTE — Progress Notes (Signed)
Subjective:    Patient ID: Marcus Torres, male    DOB: [birth year], 41 y.o.   MRN: [id number]  HPI F/U hyponatremia -to follow-up for hyponatremia-he is down to 1 tab of sodium daily.  Over the last week though he is started and that he is noticed he is getting some bilateral ankle swelling.  Is due to repeat blood work today.  OSA -diagnosed with severe sleep apnea and did contact his sleep specialist.  They were able to get his CPAP supplies ordered which he got last week.  He was unable to tolerate a pressure of 18 and so had it turned down to 12-14 and is actually been doing well with that thus far.  In fact he showed me his apnea on his phone where his AHI on average was less than 1 which is actually fantastic.  He has been trying to wear it consistently.  On average around 4 hours each night.  Alcohol abuse - he hasn't been abstinant. Says had a couple of drinks at a Christmas party last week.    Review of Systems  BP 139/69   Pulse 70   Ht 6\' 2"  (1.88 m)   Wt (!) 344 lb (156 kg)   SpO2 98%   BMI 44.17 kg/m     Allergies  Allergen Reactions  . Lisinopril-Hydrochlorothiazide     REACTION: cough    Past Medical History:  Diagnosis Date  . Alcohol abuse   . Elevated liver enzymes   . Hypertension   . Normal cardiac stress test 05/09/2011   Negative treadmill performed at [facility] cardiology    Past Surgical History:  Procedure Laterality Date  . ARTHROSCOPIC REPAIR ACL  2016   Dr. Annie Main Pill     Social History   Socioeconomic History  . Marital status: Married    Spouse name: Not on file  . Number of children: Not on file  . Years of education: Not on file  . Highest education level: Not on file  Social Needs  . Financial resource strain: Not on file  . Food insecurity - worry: Not on file  . Food insecurity - inability: Not on file  . Transportation needs - medical: Not on file  . Transportation needs - non-medical: Not on file  Occupational History   . Not on file  Tobacco Use  . Smoking status: Current Every Day Smoker    Packs/day: 0.25    Types: Cigarettes  . Smokeless tobacco: Never Used  Substance and Sexual Activity  . Alcohol use: Yes    Alcohol/week: 14.0 oz    Types: 28 drink(s) per week    Comment: per week  . Drug use: No  . 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 Not on file  Social History Narrative  . Not on file    Family History  Problem Relation Age of Onset  . Hypertension Mother   . Hyperlipidemia Mother   . Other Other        Cardiac stent    Outpatient Encounter Medications as of 10/17/2017  Medication Sig  . AMBULATORY NON FORMULARY MEDICATION Medication Name:CPAP machine and supplies with humidifier. Set to 15 cm of water pressure. Dx: G 47.33 severe OSA. Fax to [facility]  . amLODipine (NORVASC) 5 MG tablet Take 5 mg by mouth daily.  . metoprolol tartrate (LOPRESSOR) 50 MG tablet TAKE ONE TABLET BY MOUTH TWO TIMES A  DAY  . Multiple Vitamin (THERA) TABS Take 1 tablet by mouth daily.  . nitroGLYCERIN (NITROSTAT) 0.4 MG SL tablet Place 1 tablet (0.4 mg total) under the tongue every 5 (five) minutes as needed for chest pain (or tightness).  . pantoprazole (PROTONIX) 40 MG tablet Take 40 mg by mouth daily.  . sodium chloride 1 g tablet Take 1 g by mouth 3 (three) times daily.  Marland Kitchen thiamine 100 MG tablet Take 100 mg by mouth daily.  . [DISCONTINUED] folic acid (FOLVITE) 1 MG tablet Take 1 mg by mouth daily.   No facility-administered encounter medications on file as of 10/17/2017.         Objective:   Physical Exam  Constitutional: He is oriented to person, place, and time. He appears well-developed and well-nourished.  HENT:  Head: Normocephalic and atraumatic.  Cardiovascular: Normal rate, regular rhythm and normal heart sounds.  Pulmonary/Chest: Effort normal and breath sounds normal.  Musculoskeletal:  Trace ankle edema bilaterally.   Neurological: He is alert and oriented to person, place, and time.  Skin: Skin is warm and dry.  Psychiatric: He has a normal mood and affect. His behavior is normal.        Assessment & Plan:  Hyponatremia -day.  Hopefully can come off of sodium.  I suspect that at this point his body has regulated and the sodium in excess is probably causing his lower extremity edema.  Lower extremity edema-if were able to get him off the sodium I want him to give it a week and see if the swelling goes down.  If it does not then please let me know.  OSA -doing very well.  AHI is less than 1 based on his app on his phone.  Specialist managing.  Alcohol abuse -he has not been abstinent and just encouraged him to really continue to work hard on this.

## 2017-10-18 ENCOUNTER — Encounter: Payer: Self-pay | Admitting: Family Medicine

## 2017-11-05 ENCOUNTER — Other Ambulatory Visit: Payer: Self-pay | Admitting: Family Medicine

## 2017-11-05 DIAGNOSIS — I10 Essential (primary) hypertension: Secondary | ICD-10-CM

## 2017-12-01 ENCOUNTER — Telehealth: Payer: Self-pay | Admitting: Family Medicine

## 2017-12-01 NOTE — Telephone Encounter (Signed)
Called and informed pt's wife we do have the STD paperwork however Dr. Madilyn Fireman has already completed this paperwork for him when was seen in November. Also informed her that she did see where he was seen at the ED however he has not f/u with her about this.   She stated that he was seen at [facility] and was seen by [facility] and was a direct admit to [facility] in [place]. I informed her that she will need to reach out to [facility] and ask them to assist her with his STD forms. Maryruth Eve, Lahoma Crocker, CMA

## 2017-12-01 NOTE — Telephone Encounter (Signed)
Please call patient: Some paperwork was dropped off for short-term disability.  I went back and looked and realized I had Marcus Torres filled out short-term disability paperwork for his admission to the hospital back in November.  I can see in the computer where he was seen at the emergency department but I am not aware of any other reason for him to currently be out of work.

## 2017-12-29 ENCOUNTER — Telehealth: Payer: Self-pay | Admitting: Family Medicine

## 2017-12-29 NOTE — Telephone Encounter (Signed)
Pt's forms completed, faxed, confirmation received,and scanned into chart.Marcus Torres, [place]

## 2017-12-29 NOTE — Telephone Encounter (Signed)
Patient's wife called about STD paperwork and I advised her of prevs phone note from Feb 1st. Pt's wife stated Husband went to [facility] and got notes and information and brought it to our office in a envelope that office stated as long as his pcp had the office notes and paperwork there she should be able to complete STD  Paperwork. Pt thought Dr. Madilyn Fireman was completing paperwork since wife dropped it off a week or so ago but adv no record of completed forms. Can he just f/up with Dr. Madilyn Fireman and get STD info completed by pcp. Pt's wife request a call back [phone] Flonnie Hailstone. Thanks

## 2017-12-29 NOTE — Telephone Encounter (Signed)
Called and informed pt's wife that his forms have been completed and faxed. I did let her know the 2nd page was missing and that she can p/u the originals at the front desk.Elouise Munroe, [place]

## 2018-02-03 ENCOUNTER — Other Ambulatory Visit: Payer: Self-pay | Admitting: Family Medicine

## 2018-02-03 DIAGNOSIS — I10 Essential (primary) hypertension: Secondary | ICD-10-CM

## 2018-03-27 ENCOUNTER — Ambulatory Visit (INDEPENDENT_AMBULATORY_CARE_PROVIDER_SITE_OTHER): Payer: BLUE CROSS/BLUE SHIELD | Admitting: Family Medicine

## 2018-03-27 ENCOUNTER — Encounter: Payer: Self-pay | Admitting: Family Medicine

## 2018-03-27 VITALS — BP 139/65 | HR 89 | Ht 74.0 in | Wt 354.0 lb

## 2018-03-27 DIAGNOSIS — R740 Nonspecific elevation of levels of transaminase and lactic acid dehydrogenase [LDH]: Secondary | ICD-10-CM | POA: Diagnosis not present

## 2018-03-27 DIAGNOSIS — F1010 Alcohol abuse, uncomplicated: Secondary | ICD-10-CM | POA: Diagnosis not present

## 2018-03-27 DIAGNOSIS — D649 Anemia, unspecified: Secondary | ICD-10-CM | POA: Diagnosis not present

## 2018-03-27 DIAGNOSIS — R7401 Elevation of levels of liver transaminase levels: Secondary | ICD-10-CM

## 2018-03-27 MED ORDER — LORAZEPAM 1 MG PO TABS
1.0000 mg | ORAL_TABLET | Freq: Three times a day (TID) | ORAL | 0 refills | Status: DC | PRN
Start: 2018-03-27 — End: 2018-06-14

## 2018-03-27 MED ORDER — NITROGLYCERIN 0.4 MG SL SUBL: 0.4000 mg | SUBLINGUAL_TABLET | SUBLINGUAL | 3 refills | Status: DC | PRN

## 2018-03-27 NOTE — Progress Notes (Signed)
Subjective:    Patient ID: Marcus Torres, male    DOB: 12-27-1975, 42 y.o.   MRN: [id number]  HPI  42 year old male is here today to follow-up for recent hospital visit.  He was seen at [facility] on May 27 for chest pain.  He has a history of alcohol abuse and wanted to undergo voluntary inpatient alcohol detoxification.  He also noticed some increased shortness of breath and anxiety that resolved when he was given Ativan.  Troponins were negative.  Review of labs from the hospital his AST was elevated at 103.  Hemoglobin was also low at 8.3 with a low MCV.  Was given a list of resources for rehab.  He has had blood in stool in the past and in fact reports that he had a colonoscopy done in January.  Did make a couple called phone calls for inpatient programs.  He does not want to go back to [facility].  And he does not want to go to [facility] is a 21-day program.  I gave him the Ativan twice while he was in the ED yesterday and says that did help prevent him from having DTs.  He would like to have a prescription for a few more days as he tries to become sober. His wife just went through a program at [facility].    Review of Systems  BP 139/65   Pulse 89   Ht 6\' 2"  (1.88 m)   Wt (!) 354 lb (160.6 kg)   SpO2 97%   BMI 45.45 kg/m     Allergies  Allergen Reactions  . Lisinopril-Hydrochlorothiazide     REACTION: cough    Past Medical History:  Diagnosis Date  . Alcohol abuse   . Elevated liver enzymes   . Hypertension   . Normal cardiac stress test 05/09/2011   Negative treadmill performed at [facility] cardiology    Past Surgical History:  Procedure Laterality Date  . ARTHROSCOPIC REPAIR ACL  2016   Dr. Annie Main Pill     Social History   Socioeconomic History  . Marital status: Married    Spouse name: Not on file  . Number of children: Not on file  . Years of education: Not on file  . Highest education level: Not on file  Occupational History  . Not on file   Social Needs  . Financial resource strain: Not on file  . Food insecurity:    Worry: Not on file    Inability: Not on file  . Transportation needs:    Medical: Not on file    Non-medical: Not on file  Tobacco Use  . Smoking status: Current Every Day Smoker    Packs/day: 0.25    Types: Cigarettes  . Smokeless tobacco: Never Used  Substance and Sexual Activity  . Alcohol use: Yes    Alcohol/week: 14.0 oz    Types: 28 drink(s) per week    Comment: per week  . Drug use: No  . 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 Physical activity:    Days per week: Not on file    Minutes per session: Not on file  . Stress: Not on file  Relationships  . Social connections:    Talks on phone: Not on file    Gets together: Not on file    Attends religious service: Not on file    Active member of club or organization: Not on file  Attends meetings of clubs or organizations: Not on file    Relationship status: Not on file  . Intimate partner violence:    Fear of current or ex partner: Not on file    Emotionally abused: Not on file    Physically abused: Not on file    Forced sexual activity: Not on file  Other Topics Concern  . Not on file  Social History Narrative  . Not on file    Family History  Problem Relation Age of Onset  . Hypertension Mother   . Hyperlipidemia Mother   . Other Other        Cardiac stent    Outpatient Encounter Medications as of 03/27/2018  Medication Sig  . AMBULATORY NON FORMULARY MEDICATION Medication Name:CPAP machine and supplies with humidifier. Set to 15 cm of water pressure. Dx: G 47.33 severe OSA. Fax to [facility]  . metoprolol tartrate (LOPRESSOR) 50 MG tablet TAKE ONE TABLET BY MOUTH TWO TIMES A DAY  . Multiple Vitamin (THERA) TABS Take 1 tablet by mouth daily.  Jabier Gauss 40-5-25 MG TABS TAKE 1 TABLET BY MOUTH EVERY DAY  . LORazepam (ATIVAN) 1 MG tablet Take 1 tablet (1 mg total) by mouth every 8 (eight)  hours as needed for anxiety.  . nitroGLYCERIN (NITROSTAT) 0.4 MG SL tablet Place 1 tablet (0.4 mg total) under the tongue every 5 (five) minutes as needed for chest pain.  . [DISCONTINUED] amLODipine (NORVASC) 5 MG tablet Take 5 mg by mouth daily.  . [DISCONTINUED] nitroGLYCERIN (NITROSTAT) 0.4 MG SL tablet Place 1 tablet (0.4 mg total) under the tongue every 5 (five) minutes as needed for chest pain (or tightness).  . [DISCONTINUED] pantoprazole (PROTONIX) 40 MG tablet Take 40 mg by mouth daily.  . [DISCONTINUED] sodium chloride 1 g tablet Take 1 g by mouth 3 (three) times daily.  . [DISCONTINUED] thiamine 100 MG tablet Take 100 mg by mouth daily.   No facility-administered encounter medications on file as of 03/27/2018.            Objective:   Physical Exam  Constitutional: He is oriented to person, place, and time. He appears well-developed and well-nourished.  HENT:  Head: Normocephalic and atraumatic.  Cardiovascular: Normal rate, regular rhythm and normal heart sounds.  Pulmonary/Chest: Effort normal and breath sounds normal.  Neurological: He is alert and oriented to person, place, and time.  Skin: Skin is warm and dry.  Mildly jaundiced on exam.   Psychiatric: He has a normal mood and affect. His behavior is normal.        Assessment & Plan:   Alcohol abuse.  I did prescribe Ativan for 4 more days but did remind him that he needs to get involved in either an intensive outpatient program or an intensive inpatient program.  They really can provide the support that he will need to be successful to become free of alcohol.  He has the phone numbers and information and has Artie made a couple of phone calls.  Encouraged him to just continue to work on this.  Anemia -low hemoglobin concerning for possible GI ulceration leading to blood  loss.  Prior hx of GI bleeding.  Will recheck CBC and MCV today for confirmation.   Elevated AST-we will plan to recheck this in 2 to 3 weeks to  make sure that it is coming down.

## 2018-03-28 LAB — CBC
HCT: 26.7 % — ABNORMAL LOW (ref 38.5–50.0)
Hemoglobin: 8.2 g/dL — ABNORMAL LOW (ref 13.2–17.1)
MCH: 22.3 pg — ABNORMAL LOW (ref 27.0–33.0)
MCHC: 30.7 g/dL — ABNORMAL LOW (ref 32.0–36.0)
MCV: 72.8 fL — ABNORMAL LOW (ref 80.0–100.0)
MPV: 10.1 fL (ref 7.5–12.5)
Platelets: 128 10*3/uL — ABNORMAL LOW (ref 140–400)
RBC: 3.67 10*6/uL — ABNORMAL LOW (ref 4.20–5.80)
RDW: 17.8 % — ABNORMAL HIGH (ref 11.0–15.0)
WBC: 3.4 10*3/uL — ABNORMAL LOW (ref 3.8–10.8)

## 2018-03-28 LAB — FERRITIN: Ferritin: 41 ng/mL (ref 20–380)

## 2018-03-28 LAB — B12 AND FOLATE PANEL
Folate: 11 ng/mL
Vitamin B-12: 474 pg/mL (ref 200–1100)

## 2018-03-30 ENCOUNTER — Other Ambulatory Visit: Payer: Self-pay | Admitting: *Deleted

## 2018-03-30 DIAGNOSIS — D649 Anemia, unspecified: Secondary | ICD-10-CM

## 2018-04-10 ENCOUNTER — Ambulatory Visit (INDEPENDENT_AMBULATORY_CARE_PROVIDER_SITE_OTHER): Payer: BLUE CROSS/BLUE SHIELD | Admitting: Psychology

## 2018-04-10 ENCOUNTER — Encounter (HOSPITAL_COMMUNITY): Payer: Self-pay | Admitting: Psychology

## 2018-04-10 DIAGNOSIS — F1020 Alcohol dependence, uncomplicated: Secondary | ICD-10-CM

## 2018-04-16 ENCOUNTER — Telehealth (HOSPITAL_COMMUNITY): Payer: Self-pay | Admitting: Psychology
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
## 2018-04-20 NOTE — Progress Notes (Signed)
Comprehensive Clinical Assessment (CCA) Note  04/20/2018 Marcus Torres [id number]  Visit Diagnosis:      ICD-10-CM   1. Alcohol use disorder, severe, dependence ([place]) F10.20       CCA Part One  Part One has been completed on paper by the patient.  (See scanned document in Chart Review)  CCA Part Two A  Intake/Chief Complaint:  CCA Intake With Chief Complaint CCA Part Two Date: 04/10/18 CCA Part Two Time: 1649 Chief Complaint/Presenting Problem: I have sought out help before and I have completed three alcohol detoxes this year, but I never followed through with their suggestions. Now DSS is involved and the two kids are currently at my mother-in-law's home. My wife and I are both supposed to enter some kind of treatment Patients Currently Reported Symptoms/Problems: I feel lethargy and hardly any motivation. I have missed a lot of days at work. I don't feel good at times and I am just sick of this and really upset that my kids have been taken from me.  Collateral Involvement: Patient has hired an Forensic psychologist and DSS is involved. If he decides to return and enter the program he will have to sign some sort of consent allowing [place] to speak and verify his reports.  Individual's Strengths: stable work history, supportive spouse, appears motivated, even if it is because of DSS Individual's Preferences: whatever it will take to get his kids back Individual's Abilities: Has transportation, his company is willing to accomodate his treatment schedule and let him work around the program.  Type of Services Patient Feels Are Needed: whatever the assessment reveals is what I will do. Initial Clinical Notes/Concerns: the patient has been drinking for a long time. It is unclear why he completed three detoxes this year, but never followed through with any of the recommendations upon discharge. He has never been to [facility]. It is likely he is only doing this because of DSS  Mental Health Symptoms Depression:   Depression: Hopelessness, Fatigue, Sleep (too much or little), Worthlessness, Change in energy/activity  Mania:  Mania: N/A  Anxiety:   Anxiety: Worrying, Sleep, Restlessness, Irritability, Tension  Psychosis:  Psychosis: N/A  Trauma:  Trauma: N/A  Obsessions:  Obsessions: N/A  Compulsions:  Compulsions: N/A  Inattention:  Inattention: N/A  Hyperactivity/Impulsivity:  Hyperactivity/Impulsivity: N/A  Oppositional/Defiant Behaviors:  Oppositional/Defiant Behaviors: N/A  Borderline Personality:  Emotional Irregularity: N/A  Other Mood/Personality Symptoms:  Other Mood/Personality Symtpoms: Patient wants to see a psychiatrist or prescriber because he believes he is depressed.    Mental Status Exam Appearance and self-care  Stature:  Stature: Average  Weight:  Weight: Obese  Clothing:  Clothing: Casual  Grooming:  Grooming: Normal  Cosmetic use:  Cosmetic Use: None  Posture/gait:  Posture/Gait: Normal  Motor activity:  Motor Activity: Not Remarkable  Sensorium  Attention:  Attention: Normal  Concentration:  Concentration: Normal  Orientation:  Orientation: X5  Recall/memory:  Recall/Memory: Normal  Affect and Mood  Affect:  Affect: Appropriate  Mood:     Relating  Eye contact:  Eye Contact: Normal  Facial expression:  Facial Expression: Responsive  Attitude toward examiner:  Attitude Toward Examiner: Cooperative  Thought and Language  Speech flow: Speech Flow: Normal  Thought content:  Thought Content: Appropriate to mood and circumstances  Preoccupation:     Hallucinations:     Organization:     Transport planner of Knowledge:  Fund of Knowledge: Average  Intelligence:  Intelligence: Average  Abstraction:  Abstraction: Normal  Judgement:  Judgement: Fair  Art therapist:  Reality Testing: Realistic  Insight:  Insight: Fair  Decision Making:  Decision Making: Impulsive  Social Functioning  Social Maturity:  Social Maturity: Irresponsible, Impulsive, Isolates   Social Judgement:  Social Judgement: "Games developer"  Stress  Stressors:  Stressors: Work, Family conflict, Money  Coping Ability:  Coping Ability: Research officer, political party Deficits:     Supports:      Family and Psychosocial History: Family history Marital status: Married Number of Years Married: 32 What types of issues is patient dealing with in the relationship?: he and his wife both drink alcoholically and both are seeking help. This seem,s likely to be the result of DSS removing them from the home. Are you sexually active?: Yes What is your sexual orientation?: Heterosexual Has your sexual activity been affected by drugs, alcohol, medication, or emotional stress?: no Does patient have children?: Yes How many children?: 2 How is patient's relationship with their children?: patient reports a good relationship with children. They are currently staying with their maternal grandmother having been removed from the home by DSS. The kids told the counselor their parents are drinking and they fight - patient denies this  Childhood History:  Childhood History By whom was/is the patient raised?: Mother Additional childhood history information: Parents divorced when he was 18 years old. Has little memory of his father. Mother tried to take him places to do things with other boys.  Description of patient's relationship with caregiver when they were a child: Patient has little memory of father or only memories that are negative. loves his mother and enjoued a good relationship with her and his older sister. Didn't have a lot of money and "I worked since I was 42 yo". Patient's description of current relationship with people who raised him/her: patietn is close with his mother and speaks with her weekly. She lives in [place], [place] How were you disciplined when you got in trouble as a child/adolescent?: appropriately Does patient have siblings?: Yes Number of Siblings: 1 Description of patient's current  relationship with siblings: pateitn reports a good relationship with his older sister. She lives in [place], is married and has two children. Did patient suffer any verbal/emotional/physical/sexual abuse as a child?: No Did patient suffer from severe childhood neglect?: No Has patient ever been sexually abused/assaulted/raped as an adolescent or adult?: No Was the patient ever a victim of a crime or a disaster?: No Witnessed domestic violence?: No Has patient been effected by domestic violence as an adult?: No  CCA Part Two B  Employment/Work Situation: Employment / Work Situation Employment situation: Employed Where is patient currently employed?: [employer]  How long has patient been employed?: altogether 12 years, but he worked for [employer] for 12 years inbetween. he had worked for [employer] in [place] and moved to [place] [employer] for [place] with [employer]. Returned to  [employer] in 2012. Patient's job has been impacted by current illness: Yes Describe how patient's job has been impacted: The patient has called out of work or missed due to being in the hospital for alcohol detox.  What is the longest time patient has a held a job?: 13 years Where was the patient employed at that time?: [employer] Did You Receive Any Psychiatric Treatment/Services While in the [employer]?: No Are There Guns or Other Weapons in [place]?: No  Education: Museum/gallery curator Currently Attending: N/A Last Grade Completed: 12 Name of [place] in [place] Did You Graduate From [employer]?: Yes  Did You Attend College?: No Did [place]?: No Did You Have Any Special Interests In School?: I didn't like school, but I played a lot of sports. 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 I work a lot and have two children so there isn't a lot of time for me  Exercise/Diet: Exercise/Diet Do You Exercise?: No Have You Gained or Lost A Significant Amount of Weight in the Past Six Months?: No Do You Follow a Special Diet?: No Do You Have Any Trouble Sleeping?: Yes Explanation of Sleeping Difficulties: My sleep isn't goot - I am either unable to stay asleep or wake up a lot  CCA Part Two C  Alcohol/Drug Use: Alcohol / Drug Use Pain Medications: N/A Prescriptions: Yes - high blood pressure - Metoprolol Tartrate 50 mg  and Tribenzor 40 mg Over the Counter: N/A History of alcohol / drug use?: Yes Longest period of sobriety (when/how long): maybe a week - I was in detox a couple times Negative Consequences of Use: Work / Youth worker, Charity fundraiser relationships, Scientist, research (physical sciences), Museum/gallery curator Substance #1 Name of Substance 1: alcohol 1 - Age of First Use: 15 1 - Amount (size/oz): 1/2 to a fifth of vodka  1 - Frequency: Daily 1 - Duration: off and on for a few years maybe 1 - Last Use / Amount: last night - June 10 a few drinks Substance #2 Name of Substance 2: Cannabis 2 - Age of First Use: 13 2 - Amount (size/oz): a few one hitters 2 - Frequency: daily 2 - Duration: I smoked for years, but I started to experience more anxiety and paranoia so I stopped using entirely 2 - Last Use / Amount: 2017 - a few hits Substance #3 Name of Substance 3: Benzodiazepines 3 - Age of First Use: 36 3 - Amount (size/oz): .5 mg Xanax 3 - Frequency: 3-4 days a week 3 - Duration: off and on when I can get them 3 - Last Use / Amount: June 10th - yesterday - 1 pill                 CCA Part Three  ASAM's:  Six Dimensions of Multidimensional Assessment  Dimension 1:  Acute Intoxication and/or Withdrawal Potential:  Dimension 1:  Comments: Patient has  completed at least three detoxes in the last few months. It appears, bsaed on his report, that he suffers severe withdrawal and has needed a medical setting to provide the detox.   Dimension 2:  Biomedical Conditions and Complications:  Dimension 2:  Comments: Patient is obese, has high blood pressure and has what appears to be a very unhealthy lifestyle.  Dimension 3:  Emotional, Behavioral, or Cognitive Conditions and Complications:  Dimension 3:  Comments: Patient is currently not prescribed any psychotropic meds, but reports having a history of depression.   Dimension 4:  Readiness to Change:  Dimension 4:  Comments: The patient is seeking treatment after referral from Individual counselor. His children are currently with Grandmother and DSS is monitoring this case. He and wife are both seeking treatment to, likely, satisfy DSS requirements.   Dimension 5:  Relapse, Continued use, or Continued Problem Potential:  Dimension 5:  Comments: The likelihood of relapse is quite high. The patient has compelted detoxes, but not attained any significant period of sobriety. He has never attended AA. It is unclear whether he really wants to be sober.   Dimension 6:  Recovery/Living Environment:  Dimension 6:  Recovery/Living Environment Comments: Patient's wife is also an alcoholic. There is little accountability with this patient.    Substance use Disorder (SUD) Substance Use Disorder (SUD)  Checklist Symptoms of Substance Use: Evidence of tolerance, Evidence of withdrawal (Comment), Presence of craving or strong urge to use, Repeated use in physically hazardous situations, Substance(s) often taken in large amounts or over longer times than was intended, Social, occupational, recreational activities given up or reduced due to use, Recurrent use that results in a fialure to fulfill major rule obligatinos (work, school, home), Continued use despite having a persistent/recurrent physical/psychological problem  caused/exacerbated by use, Persistent desire or unsuccessful efforts to cut down or control use  Social Function:  Social Functioning Social Maturity: Irresponsible, Impulsive, Isolates Social Judgement: "Games developer"  Stress:  Stress Stressors: Work, Family conflict, Money Coping Ability: Exhausted Patient Takes Medications The Way The Doctor Instructed?: No Priority Risk: Low Acuity  Risk Assessment- Self-Harm Potential: Risk Assessment For Self-Harm Potential Thoughts of Self-Harm: No current thoughts Method: No plan Availability of Means: No access/NA Additional Comments for Self-Harm Potential: Patient has no history of self-harm  Risk Assessment -Dangerous to Others Potential: Risk Assessment For Dangerous to Others Potential Method: No Plan Availability of Means: No access or NA Intent: Vague intent or NA Notification Required: No need or identified person Additional Comments for Danger to Others Potential: Not a violent person and no family history of HI.  DSM5 Diagnoses: Patient Active Problem List   Diagnosis Date Noted  . 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 OSA (obstructive sleep apnea) 09/11/2017  . Chronic renal insufficiency, stage 3 (moderate) ([place]) 10/28/2016  . 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 Lymphopenia 10/28/2016  . Iron deficiency anemia due to chronic blood loss 10/28/2016  . Hyponatremia 10/28/2016  . Hepatosplenomegaly 10/28/2016  . GI bleeding 10/28/2016  . Elevated liver function tests 10/28/2016  . Acute kidney injury ([place]) 10/21/2016  . Lumbosacral strain 04/07/2016  . Tear of medial meniscus of right knee, subsequent encounter 07/12/2015  . Rupture of anterior cruciate ligament of right knee 07/12/2015  . Severe obesity (BMI >= 40) ([place]) 07/18/2014  . Lumbar degenerative disc disease 12/05/2013  . Kidney stone 09/02/2013  . Alcoholic steatohepatitis 13/05/6577  . HEADACHE 11/12/2010  . Essential hypertension  04/02/2010  . TRANSAMINASES, SERUM, ELEVATED 01/06/2010  . CHEST PAIN, ATYPICAL 12/25/2009  . Hyperlipidemia 01/09/2009  . ALCOHOL ABUSE 01/09/2009  . ESSENTIAL HYPERTENSION, BENIGN 01/09/2009    Patient Centered Plan: Patient is on the following Treatment Plan(s): Patient is encouraged to enter the CD-IOP. He will speak with his wife and discuss time frame. Patient was reminded that if he has not stopped drinking for a period of time, he will be referred for a medically-monitored alcohol detox. Patient has agreed to contact this counselor with the game plan.   Recommendations for Services/Supports/Treatments: Recommendations for Services/Supports/Treatments Recommendations For Services/Supports/Treatments: CD-IOP Intensive Chemical Dependency Program, Medication Management  Treatment Plan Summary: Begin the CD-IOP, learn tools and strategies to address cravings, attain sobriety, engage in the [facility] of [facility].    Referrals to Alternative Service(s): Referred to Alternative Service(s):   Place:   Date:  Time:    Referred to Alternative Service(s):   Place:   Date:   Time:    Referred to Alternative Service(s):   Place:   Date:   Time:    Referred to Alternative Service(s):   Place:   Date:   Time:     Brandon Melnick

## 2018-04-28 ENCOUNTER — Other Ambulatory Visit: Payer: Self-pay | Admitting: Family Medicine

## 2018-04-28 DIAGNOSIS — I10 Essential (primary) hypertension: Secondary | ICD-10-CM

## 2018-05-15 ENCOUNTER — Other Ambulatory Visit: Payer: Self-pay | Admitting: Family

## 2018-05-15 DIAGNOSIS — D500 Iron deficiency anemia secondary to blood loss (chronic): Secondary | ICD-10-CM

## 2018-05-15 DIAGNOSIS — D649 Anemia, unspecified: Secondary | ICD-10-CM

## 2018-05-16 ENCOUNTER — Inpatient Hospital Stay: Payer: BLUE CROSS/BLUE SHIELD | Attending: Family | Admitting: Family

## 2018-05-16 ENCOUNTER — Other Ambulatory Visit: Payer: Self-pay

## 2018-05-16 ENCOUNTER — Telehealth: Payer: Self-pay | Admitting: *Deleted

## 2018-05-16 ENCOUNTER — Inpatient Hospital Stay: Payer: BLUE CROSS/BLUE SHIELD

## 2018-05-16 ENCOUNTER — Encounter: Payer: Self-pay | Admitting: Family

## 2018-05-16 VITALS — BP 116/51 | HR 66 | Temp 98.2°F | Resp 20 | Ht 74.0 in | Wt 325.4 lb

## 2018-05-16 DIAGNOSIS — D500 Iron deficiency anemia secondary to blood loss (chronic): Secondary | ICD-10-CM

## 2018-05-16 DIAGNOSIS — I10 Essential (primary) hypertension: Secondary | ICD-10-CM

## 2018-05-16 DIAGNOSIS — D649 Anemia, unspecified: Secondary | ICD-10-CM

## 2018-05-16 DIAGNOSIS — D509 Iron deficiency anemia, unspecified: Secondary | ICD-10-CM | POA: Insufficient documentation

## 2018-05-16 DIAGNOSIS — K7010 Alcoholic hepatitis without ascites: Secondary | ICD-10-CM

## 2018-05-16 DIAGNOSIS — R7989 Other specified abnormal findings of blood chemistry: Secondary | ICD-10-CM

## 2018-05-16 DIAGNOSIS — N200 Calculus of kidney: Secondary | ICD-10-CM

## 2018-05-16 DIAGNOSIS — D508 Other iron deficiency anemias: Secondary | ICD-10-CM

## 2018-05-16 LAB — CMP (CANCER CENTER ONLY)
ALT: 66 U/L — ABNORMAL HIGH (ref 10–47)
AST: 104 U/L — ABNORMAL HIGH (ref 11–38)
Albumin: 4 g/dL (ref 3.5–5.0)
Alkaline Phosphatase: 230 U/L — ABNORMAL HIGH (ref 26–84)
Anion gap: 16 — ABNORMAL HIGH (ref 5–15)
BUN: 59 mg/dL — ABNORMAL HIGH (ref 7–22)
CO2: 19 mmol/L (ref 18–33)
Calcium: 10.2 mg/dL (ref 8.0–10.3)
Chloride: 104 mmol/L (ref 98–108)
Creatinine: 3.3 mg/dL (ref 0.60–1.20)
Glucose, Bld: 117 mg/dL (ref 73–118)
Potassium: 4.7 mmol/L (ref 3.3–4.7)
Sodium: 139 mmol/L (ref 128–145)
Total Bilirubin: 1.6 mg/dL (ref 0.2–1.6)
Total Protein: 9.5 g/dL — ABNORMAL HIGH (ref 6.4–8.1)

## 2018-05-16 LAB — CBC WITH DIFFERENTIAL (CANCER CENTER ONLY)
Basophils Absolute: 0.1 10*3/uL (ref 0.0–0.1)
Basophils Relative: 1 %
Eosinophils Absolute: 0.2 10*3/uL (ref 0.0–0.5)
Eosinophils Relative: 5 %
HCT: 28.2 % — ABNORMAL LOW (ref 38.7–49.9)
Hemoglobin: 8.9 g/dL — ABNORMAL LOW (ref 13.0–17.1)
Lymphocytes Relative: 16 %
Lymphs Abs: 0.7 10*3/uL — ABNORMAL LOW (ref 0.9–3.3)
MCH: 25.6 pg — ABNORMAL LOW (ref 28.0–33.4)
MCHC: 31.6 g/dL — ABNORMAL LOW (ref 32.0–35.9)
MCV: 81 fL — ABNORMAL LOW (ref 82.0–98.0)
Monocytes Absolute: 0.4 10*3/uL (ref 0.1–0.9)
Monocytes Relative: 10 %
Neutro Abs: 2.8 10*3/uL (ref 1.5–6.5)
Neutrophils Relative %: 68 %
Platelet Count: 151 10*3/uL (ref 145–400)
RBC: 3.48 MIL/uL — ABNORMAL LOW (ref 4.20–5.70)
RDW: 20.4 % — ABNORMAL HIGH (ref 11.1–15.7)
WBC Count: 4.1 10*3/uL (ref 4.0–10.0)

## 2018-05-16 LAB — SAVE SMEAR

## 2018-05-16 NOTE — Progress Notes (Signed)
Hematology/Oncology Consultation   Name: Marcus Torres      MRN: [id number]    Location: Room/bed info not found  Date: 05/16/2018 Time:3:26 PM   REFERRING PHYSICIAN: Beatrice Lecher, MD  REASON FOR CONSULT: Low hemoglobin    DIAGNOSIS:  Iron deficiency anemia Erythropoietin deficiency anemia   HISTORY OF PRESENT ILLNESS: Marcus Torres is a pleasant 42 yo caucasian gentleman with anemia that after looking through his labs in care everywhere began to trend downward (Hgb) in late 2017. Today's Hgb is 8.8 with an MCV of 81.  He has struggled with alcoholism for the last 15 years and been in and out of treatment. He was drinking 1/5 in the evenings at one point. He states that he has been in treatment for 3 weeks now but "has not been sober that long".  When he is drinking and not eating well he has episodes of diarrhea.  He has been hospitalized in the last 8 months for hyponatremia and elevated LFT's associated with alcohol abuse and dehydration.  His appetite comes and goes but he admits that he needs to better hydrate. His weight is stable.  LFT's today are elevated. AST 104, ALT 66 and alk phos 230. Abdominal US in May at [facility] showed hepatomegaly with underlying steatosis and/or fibrosis.   His BUN and creatinine are significantly elevated which is quite concerning. He states that he has had no issues emptying his bladder. No fluid retention.  He had his colonoscopy last year in November with 2 polyps removed.  No fever, chills, n/v, cough, rash, dizziness, SOB, chest pain, palpitations, abdominal pain or changes in bowel or bladder habits.  No swelling, tenderness in his extremities. He has noted numbness and tingling in his toes.  He is not a smoker.  He is married with 2 children and works full time as an Chartered certified accountant.   ROS: All other 10 point review of systems is negative.   PAST MEDICAL HISTORY:   Past Medical History:  Diagnosis Date  . Alcohol abuse   . Elevated liver  enzymes   . Hypertension   . Normal cardiac stress test 05/09/2011   Negative treadmill performed at [facility] cardiology    ALLERGIES: Allergies  Allergen Reactions  . Lisinopril-Hydrochlorothiazide     REACTION: cough      MEDICATIONS:  Current Outpatient Medications on File Prior to Visit  Medication Sig Dispense Refill  . AMBULATORY NON FORMULARY MEDICATION Medication Name:CPAP machine and supplies with humidifier. Set to 15 cm of water pressure. Dx: G 47.33 severe OSA. Fax to [facility] 1 Units 0  . FLUoxetine (PROZAC) 20 MG capsule Take 20 mg by mouth daily.    . hydrOXYzine (VISTARIL) 25 MG capsule Take 25 mg by mouth.  0  . metoprolol tartrate (LOPRESSOR) 50 MG tablet TAKE ONE TABLET BY MOUTH TWO TIMES A DAY 60 tablet 3  . naltrexone (DEPADE) 50 MG tablet Take 50 mg by mouth daily.    . nitroGLYCERIN (NITROSTAT) 0.4 MG SL tablet Place 1 tablet (0.4 mg total) under the tongue every 5 (five) minutes as needed for chest pain. 12 tablet 3  . TRIBENZOR 40-5-25 MG TABS TAKE 1 TABLET BY MOUTH EVERY DAY 90 tablet 0  . LORazepam (ATIVAN) 1 MG tablet Take 1 tablet (1 mg total) by mouth every 8 (eight) hours as needed for anxiety. 12 tablet 0  . Multiple Vitamin (THERA) TABS Take 1 tablet by mouth daily.     No current facility-administered  medications on file prior to visit.      PAST SURGICAL HISTORY Past Surgical History:  Procedure Laterality Date  . ARTHROSCOPIC REPAIR ACL  2016   Dr. Annie Main Torres     FAMILY HISTORY: Family History  Problem Relation Age of Onset  . Hypertension Mother   . Hyperlipidemia Mother   . Other Other        Cardiac stent    SOCIAL HISTORY:  reports that he has been smoking cigarettes.  He has been smoking about 0.25 packs per day. He has never used smokeless tobacco. He reports that he drinks about 16.8 oz of alcohol per week. He reports that he does not use drugs.  PERFORMANCE STATUS: The patient's performance status is 1 -  Symptomatic but completely ambulatory  PHYSICAL EXAM: Most Recent Vital Signs: Blood pressure (!) 116/51, pulse 66, temperature 98.2 F (36.8 C), temperature source Oral, resp. rate 20, height 6' 2"  (1.88 m), weight (!) 325 lb 6.4 oz (147.6 kg), SpO2 99 %. BP (!) 116/51 (BP Location: Left Arm, Patient Position: Sitting)   Pulse 66   Temp 98.2 F (36.8 C) (Oral)   Resp 20   Ht 6' 2"  (1.88 m)   Wt (!) 325 lb 6.4 oz (147.6 kg)   SpO2 99%   BMI 41.78 kg/m   General Appearance:    Alert, cooperative, no distress, appears stated age  Head:    Normocephalic, without obvious abnormality, atraumatic  Eyes:    PERRL, conjunctiva/corneas clear, EOM's intact, fundi    benign, both eyes             Throat:   Lips, mucosa, and tongue normal; teeth and gums normal  Neck:   Supple, symmetrical, trachea midline, no adenopathy;       thyroid:  No enlargement/tenderness/nodules; no carotid   bruit or JVD  Back:     Symmetric, no curvature, ROM normal, no CVA tenderness  Lungs:     Clear to auscultation bilaterally, respirations unlabored  Chest wall:    No tenderness or deformity  Heart:    Regular rate and rhythm, S1 and S2 normal, no murmur, rub   or gallop  Abdomen:     Soft, non-tender, bowel sounds active all four quadrants,    no masses, no organomegaly        Extremities:   Extremities normal, atraumatic, no cyanosis or edema  Pulses:   2+ and symmetric all extremities  Skin:   Skin color, texture, turgor normal, no rashes or lesions  Lymph nodes:   Cervical, supraclavicular, and axillary nodes normal  Neurologic:   CNII-XII intact. Normal strength, sensation and reflexes      throughout    LABORATORY DATA:  Results for orders placed or performed in visit on 05/16/18 (from the past 48 hour(s))  CBC with Differential ([facility] [place] Only)     Status: Abnormal   Collection Time: 05/16/18  2:47 PM  Result Value Ref Range   WBC Count 4.1 4.0 - 10.0 K/uL   RBC 3.48 (L) 4.20 - 5.70  MIL/uL   Hemoglobin 8.9 (L) 13.0 - 17.1 g/dL   HCT 28.2 (L) 38.7 - 49.9 %   MCV 81.0 (L) 82.0 - 98.0 fL   MCH 25.6 (L) 28.0 - 33.4 pg   MCHC 31.6 (L) 32.0 - 35.9 g/dL   RDW 20.4 (H) 11.1 - 15.7 %   Platelet Count 151 145 - 400 K/uL   Neutrophils Relative % 68 %  Neutro Abs 2.8 1.5 - 6.5 K/uL   Lymphocytes Relative 16 %   Lymphs Abs 0.7 (L) 0.9 - 3.3 K/uL   Monocytes Relative 10 %   Monocytes Absolute 0.4 0.1 - 0.9 K/uL   Eosinophils Relative 5 %   Eosinophils Absolute 0.2 0.0 - 0.5 K/uL   Basophils Relative 1 %   Basophils Absolute 0.1 0.0 - 0.1 K/uL    Comment: Performed at [facility] at [facility], [address], [place], [place] [address]      RADIOGRAPHY: No results found.     PATHOLOGY: None  ASSESSMENT/PLAN: Mr. Sahlin is a pleasant 42 yo caucasian gentleman with anemia that after looking through his labs in care everywhere began to trend downward (Hgb) in late 2017.  His anemia is concerning as well as this new elevation elevated BUN and Creatinine.  We will get a renal US to better evaluate his kidneys. I will also request his PCP place referral for nephrologist.  His iron is low so we will get him back in next week for infusion if needed.  Erythropoietin level was low at 6. We will hold off on Aranesp right now.  We will plan to see him back again in another month.   All questions were answered and he is in agreement with the plan. He will contact our office with any questions or concerns. We can certainly see him sooner if need be.  She was discussed with and also seen by Dr. Marin Olp and he is in agreement with the aforementioned.   Laverna Peace    ADDENDUM: I saw and examined the patient with Judson Roch.  I agree with the above assessment.  My real question is why he has renal insufficiency.  I am not sure why a 42 year old would have a creatinine of 3.3.  I would not think that alcohol would be doing this.  His renal function was  normal back in December 2018 with a creatinine of 1.08.  I am pretty sure that he is iron deficient also.  His MCV is only 81.  His blood smear shows some microcytic and hypochromic red blood cells.  We will have to see what his iron studies show.  I am sure that he will need IV iron.  He takes antacids so I do not think he would absorb oral iron.  I think an ultrasound of his kidneys would be a good start.  Ultimately, I think that he needs to be referred to nephrology.  I do not know if he needs a kidney biopsy ultimately.  I am sure a 24-hour urine probably needs to be done.  To see if he is putting out albumin.  He is very nice.  I applaud him so much for trying to quit alcohol.  I know that this is incredibly difficult to do.  Hopefully, he will be able to do it before he develops cirrhosis that will be irreversible and require a liver transplant.  We spent a good 45-50 minutes with him.  All the time was spent face-to-face.  I was talking to him about his kidneys.  Again I do not know why he has renal insufficiency.  We would probably have to get him back to see [place] in another 4 weeks or so.  Lattie Haw, MD

## 2018-05-16 NOTE — Telephone Encounter (Signed)
Critical Value Creatinine 3.3 Dr Ennever notified. No orders at this time 

## 2018-05-17 LAB — FERRITIN: Ferritin: 106 ng/mL (ref 24–336)

## 2018-05-17 LAB — LACTATE DEHYDROGENASE: LDH: 158 U/L (ref 98–192)

## 2018-05-17 LAB — IRON AND TIBC
Iron: 74 ug/dL (ref 42–163)
Saturation Ratios: 14 % — ABNORMAL LOW (ref 42–163)
TIBC: 511 ug/dL — ABNORMAL HIGH (ref 202–409)
UIBC: 437 ug/dL

## 2018-05-17 LAB — RETICULOCYTES
RBC.: 3.51 MIL/uL — ABNORMAL LOW (ref 4.20–5.82)
Retic Count, Absolute: 35.1 10*3/uL (ref 34.8–93.9)
Retic Ct Pct: 1 % (ref 0.8–1.8)

## 2018-05-17 LAB — ERYTHROPOIETIN: Erythropoietin: 6 m[IU]/mL (ref 2.6–18.5)

## 2018-05-18 ENCOUNTER — Telehealth: Payer: Self-pay | Admitting: Family Medicine

## 2018-05-18 ENCOUNTER — Telehealth: Payer: Self-pay | Admitting: Family

## 2018-05-18 DIAGNOSIS — N289 Disorder of kidney and ureter, unspecified: Secondary | ICD-10-CM

## 2018-05-18 NOTE — Telephone Encounter (Signed)
I spoke with Marcus Torres and went over his lab work with him in detail. He will need one dose of IV iron and follow-up in 1 month. I also routed his lab work to his PCP along with a request that they place a referral with nephrology. All questions were answered and he verbalized understanding and agreement with the plan.

## 2018-05-18 NOTE — Telephone Encounter (Signed)
Call patient and let him know that I did receive his labs.  We can go ahead and place a referral to nephrology and asked them to schedule you urgently.  I definitely want to recheck a BMP this week just to make sure that the kidney function goes back down a little bit.  I am also going to have him do a 24-hour protein.  The lab has been ordered and he can pick up the kit at the lab in [place].  Make sure he stops any type of anti-inflammatory such as Aleve or ibuprofen etc.

## 2018-05-18 NOTE — Telephone Encounter (Signed)
-----   Message from Eliezer Bottom, NP sent at 05/18/2018  5:07 PM EDT ----- Would you mind placing a referral with nephrology? His Creatinine and BUN are significantly elevated. This is likely contributing to his anemia. We went ahead and put in an order for renal US and will forward the result to you when available. Thanks for your help!!!  Judson Roch

## 2018-05-20 ENCOUNTER — Ambulatory Visit (HOSPITAL_BASED_OUTPATIENT_CLINIC_OR_DEPARTMENT_OTHER): Payer: BLUE CROSS/BLUE SHIELD

## 2018-05-21 NOTE — Telephone Encounter (Signed)
Left VM for Pt to return clinic call regarding results, callback information provided. 

## 2018-05-22 NOTE — Telephone Encounter (Signed)
Patient advised of results and recommendations.  

## 2018-05-23 ENCOUNTER — Ambulatory Visit (HOSPITAL_BASED_OUTPATIENT_CLINIC_OR_DEPARTMENT_OTHER): Payer: BLUE CROSS/BLUE SHIELD

## 2018-05-23 ENCOUNTER — Other Ambulatory Visit: Payer: Self-pay | Admitting: Family

## 2018-05-24 ENCOUNTER — Telehealth: Payer: Self-pay | Admitting: *Deleted

## 2018-05-24 NOTE — Telephone Encounter (Signed)
Called pt and informed him to hold Tribenzor per Dr. Joelyn Oms until appt on 06/04/2018.Marcus Torres, [place]

## 2018-05-28 ENCOUNTER — Inpatient Hospital Stay: Payer: BLUE CROSS/BLUE SHIELD

## 2018-05-28 ENCOUNTER — Ambulatory Visit (HOSPITAL_BASED_OUTPATIENT_CLINIC_OR_DEPARTMENT_OTHER): Payer: BLUE CROSS/BLUE SHIELD

## 2018-06-01 ENCOUNTER — Inpatient Hospital Stay: Payer: BLUE CROSS/BLUE SHIELD | Attending: Hematology & Oncology

## 2018-06-01 ENCOUNTER — Ambulatory Visit (HOSPITAL_BASED_OUTPATIENT_CLINIC_OR_DEPARTMENT_OTHER): Payer: BLUE CROSS/BLUE SHIELD

## 2018-06-01 DIAGNOSIS — F1099 Alcohol use, unspecified with unspecified alcohol-induced disorder: Secondary | ICD-10-CM | POA: Insufficient documentation

## 2018-06-01 DIAGNOSIS — R7989 Other specified abnormal findings of blood chemistry: Secondary | ICD-10-CM | POA: Insufficient documentation

## 2018-06-01 DIAGNOSIS — D509 Iron deficiency anemia, unspecified: Secondary | ICD-10-CM | POA: Insufficient documentation

## 2018-06-14 ENCOUNTER — Inpatient Hospital Stay (HOSPITAL_BASED_OUTPATIENT_CLINIC_OR_DEPARTMENT_OTHER): Payer: BLUE CROSS/BLUE SHIELD | Admitting: Hematology & Oncology

## 2018-06-14 ENCOUNTER — Inpatient Hospital Stay: Payer: BLUE CROSS/BLUE SHIELD

## 2018-06-14 ENCOUNTER — Encounter: Payer: Self-pay | Admitting: Hematology & Oncology

## 2018-06-14 ENCOUNTER — Other Ambulatory Visit: Payer: Self-pay

## 2018-06-14 VITALS — BP 148/71 | HR 69 | Temp 98.2°F | Resp 16 | Wt 343.0 lb

## 2018-06-14 VITALS — BP 145/75 | HR 60

## 2018-06-14 DIAGNOSIS — F1099 Alcohol use, unspecified with unspecified alcohol-induced disorder: Secondary | ICD-10-CM | POA: Diagnosis not present

## 2018-06-14 DIAGNOSIS — D500 Iron deficiency anemia secondary to blood loss (chronic): Secondary | ICD-10-CM

## 2018-06-14 DIAGNOSIS — D508 Other iron deficiency anemias: Secondary | ICD-10-CM

## 2018-06-14 DIAGNOSIS — R7989 Other specified abnormal findings of blood chemistry: Secondary | ICD-10-CM | POA: Diagnosis not present

## 2018-06-14 DIAGNOSIS — D509 Iron deficiency anemia, unspecified: Secondary | ICD-10-CM | POA: Diagnosis not present

## 2018-06-14 LAB — CMP (CANCER CENTER ONLY)
ALT: 33 U/L (ref 0–44)
AST: 63 U/L — ABNORMAL HIGH (ref 15–41)
Albumin: 3.9 g/dL (ref 3.5–5.0)
Alkaline Phosphatase: 189 U/L — ABNORMAL HIGH (ref 38–126)
Anion gap: 10 (ref 5–15)
BUN: 8 mg/dL (ref 6–20)
CO2: 24 mmol/L (ref 22–32)
Calcium: 8.8 mg/dL — ABNORMAL LOW (ref 8.9–10.3)
Chloride: 107 mmol/L (ref 98–111)
Creatinine: 0.92 mg/dL (ref 0.61–1.24)
GFR, Est AFR Am: >60 mL/min (ref >60)
GFR, Estimated: >60 mL/min (ref >60)
Glucose, Bld: 161 mg/dL — ABNORMAL HIGH (ref 70–99)
Potassium: 3.9 mmol/L (ref 3.5–5.1)
Sodium: 141 mmol/L (ref 135–145)
Total Bilirubin: 1.5 mg/dL — ABNORMAL HIGH (ref 0.3–1.2)
Total Protein: 8.3 g/dL — ABNORMAL HIGH (ref 6.5–8.1)

## 2018-06-14 LAB — CBC WITH DIFFERENTIAL (CANCER CENTER ONLY)
Basophils Absolute: 0 10*3/uL (ref 0.0–0.1)
Basophils Relative: 2 %
Eosinophils Absolute: 0.2 10*3/uL (ref 0.0–0.5)
Eosinophils Relative: 6 %
HCT: 27.4 % — ABNORMAL LOW (ref 38.7–49.9)
Hemoglobin: 8.3 g/dL — ABNORMAL LOW (ref 13.0–17.1)
Lymphocytes Relative: 21 %
Lymphs Abs: 0.5 10*3/uL — ABNORMAL LOW (ref 0.9–3.3)
MCH: 26 pg — ABNORMAL LOW (ref 28.0–33.4)
MCHC: 30.3 g/dL — ABNORMAL LOW (ref 32.0–35.9)
MCV: 85.9 fL (ref 82.0–98.0)
Monocytes Absolute: 0.2 10*3/uL (ref 0.1–0.9)
Monocytes Relative: 9 %
Neutro Abs: 1.6 10*3/uL (ref 1.5–6.5)
Neutrophils Relative %: 62 %
Platelet Count: 117 10*3/uL — ABNORMAL LOW (ref 145–400)
RBC: 3.19 MIL/uL — ABNORMAL LOW (ref 4.20–5.70)
RDW: 17.7 % — ABNORMAL HIGH (ref 11.1–15.7)
WBC Count: 2.6 10*3/uL — ABNORMAL LOW (ref 4.0–10.0)

## 2018-06-14 MED ORDER — SODIUM CHLORIDE 0.9 % IV SOLN
Freq: Once | INTRAVENOUS | Status: AC
  Administered 2018-06-14: 12:00:00 via INTRAVENOUS
  Filled 2018-06-14: qty 250

## 2018-06-14 MED ORDER — SODIUM CHLORIDE 0.9 % IV SOLN
510.0000 mg | Freq: Once | INTRAVENOUS | Status: AC
  Administered 2018-06-14: 510 mg via INTRAVENOUS
  Filled 2018-06-14: qty 17

## 2018-06-14 NOTE — Patient Instructions (Signed)

## 2018-06-15 LAB — IRON AND TIBC
Iron: 22 ug/dL — ABNORMAL LOW (ref 42–163)
Saturation Ratios: 4 % — ABNORMAL LOW (ref 42–163)
TIBC: 494 ug/dL — ABNORMAL HIGH (ref 202–409)
UIBC: 472 ug/dL

## 2018-06-15 LAB — FERRITIN: Ferritin: 14 ng/mL — ABNORMAL LOW (ref 24–336)

## 2018-06-19 NOTE — Progress Notes (Signed)
Hematology and Oncology Follow Up Visit  Marcus Torres [id number] 1976/06/08 42 y.o. 06/19/2018   Principle Diagnosis:   Iron deficiency anemia-blood loss and malabsorption  Erythropoietin deficient anemia  Current Therapy:    IV iron as indicated     Interim History:  Mr. Marcus Torres is back for second office visit.  We first saw him on May 16, 2018.  At that time, he came in because of profound anemia.  He has a significant alcohol history.  He has been drinking quite heavily.  He has had elevated LFTs.  When we first saw him, his labs showed a ferritin of 106 with an iron saturation of 14%.  His LFTs were on the elevated side.  His erythropoietin level was only 6.  I think that he is still having some difficulties with alcohol use.  Thankfully, his liver function tests are doing better.  His iron studies today, however show a ferritin of 14 with iron saturation of 4%.  He definitely is in need of IV iron.  His hemoglobin is only 8.3.  Again, he has a very low erythropoietin level.  As such, we may have to incorporate Aranesp into the protocol.  He has not noted any obvious bleeding.  He has had no leg swelling.  He has had no rashes.  He has had no fever.  There is been no cough or shortness of breath.  Overall, I would say that his performance status is ECOG 1.  Medications:  Current Outpatient Medications:  .  AMBULATORY NON FORMULARY MEDICATION, Medication Name:CPAP machine and supplies with humidifier. Set to 15 cm of water pressure. Dx: G 47.33 severe OSA. Fax to [facility], Disp: 1 Units, Rfl: 0 .  FLUoxetine (PROZAC) 20 MG capsule, Take 20 mg by mouth daily., Disp: , Rfl:  .  hydrOXYzine (VISTARIL) 25 MG capsule, Take 25 mg by mouth., Disp: , Rfl: 0 .  metoprolol tartrate (LOPRESSOR) 50 MG tablet, TAKE ONE TABLET BY MOUTH TWO TIMES A DAY, Disp: 60 tablet, Rfl: 3 .  naltrexone (DEPADE) 50 MG tablet, Take 50 mg by mouth daily., Disp: , Rfl:  .  nitroGLYCERIN (NITROSTAT)  0.4 MG SL tablet, Place 1 tablet (0.4 mg total) under the tongue every 5 (five) minutes as needed for chest pain., Disp: 12 tablet, Rfl: 3 .  TRIBENZOR 40-5-25 MG TABS, TAKE 1 TABLET BY MOUTH EVERY DAY, Disp: 90 tablet, Rfl: 0  Allergies:  Allergies  Allergen Reactions  . Lisinopril   . Lisinopril-Hydrochlorothiazide     REACTION: cough  . Losartan Cough  . Lisinopril-Hydrochlorothiazide Cough    REACTION: cough    Past Medical History, Surgical history, Social history, and Family History were reviewed and updated.  Review of Systems: Review of Systems  Constitutional: Positive for fatigue.  HENT:  Negative.   Eyes: Negative.   Respiratory: Negative.   Cardiovascular: Negative.   Gastrointestinal: Positive for abdominal pain and nausea.  Endocrine: Negative.   Genitourinary: Negative.    Musculoskeletal: Negative.   Skin: Negative.   Neurological: Negative.   Hematological: Negative.   Psychiatric/Behavioral: Negative.     Physical Exam:  weight is 343 lb (155.6 kg) (abnormal). His oral temperature is 98.2 F (36.8 C). His blood pressure is 148/71 (abnormal) and his pulse is 69. His respiration is 16 and oxygen saturation is 100%.   Wt Readings from Last 3 Encounters:  06/14/18 (!) 343 lb (155.6 kg)  05/16/18 (!) 325 lb 6.4 oz (147.6 kg)  03/27/18 Marland Kitchen)  354 lb (160.6 kg)    Physical Exam  Constitutional: He is oriented to person, place, and time.  HENT:  Head: Normocephalic and atraumatic.  Mouth/Throat: Oropharynx is clear and moist.  Eyes: Pupils are equal, round, and reactive to light. EOM are normal.  Neck: Normal range of motion.  Cardiovascular: Normal rate, regular rhythm and normal heart sounds.  Pulmonary/Chest: Effort normal and breath sounds normal.  Abdominal: Soft. Bowel sounds are normal.  Musculoskeletal: Normal range of motion. He exhibits no edema, tenderness or deformity.  Lymphadenopathy:    He has no cervical adenopathy.  Neurological: He is  alert and oriented to person, place, and time.  Skin: Skin is warm and dry. No rash noted. No erythema.  Psychiatric: He has a normal mood and affect. His behavior is normal. Judgment and thought content normal.  Vitals reviewed.    Lab Results  Component Value Date   WBC 2.6 (L) 06/14/2018   HGB 8.3 (L) 06/14/2018   HCT 27.4 (L) 06/14/2018   MCV 85.9 06/14/2018   PLT 117 (L) 06/14/2018     Chemistry      Component Value Date/Time   NA 141 06/14/2018 1027   K 3.9 06/14/2018 1027   CL 107 06/14/2018 1027   CO2 24 06/14/2018 1027   BUN 8 06/14/2018 1027   CREATININE 0.92 06/14/2018 1027   CREATININE 1.02 10/17/2017 1154      Component Value Date/Time   CALCIUM 8.8 (L) 06/14/2018 1027   ALKPHOS 189 (H) 06/14/2018 1027   AST 63 (H) 06/14/2018 1027   ALT 33 06/14/2018 1027   BILITOT 1.5 (H) 06/14/2018 1027       Impression and Plan: Mr. Marcus Torres is a 42 year old white male.  He has multifactorial anemia.  However, clearly, his biggest problem is the alcohol use.  We will go ahead and give him a dose of iron today.  I will then set him up with another dose of iron in about a week.  Whether not he needs Aranesp I think will depend on his response to the IV iron.  I will plan to see him back in about 4 weeks.  Then, we will see if we have to add Aranesp.  I talked him at length about the alcohol use.  I spent about 40 minutes with him today.  I just worry about his alcohol use.  Hopefully, this will be improved.  I think he is taking something to help prevent alcohol usage.   Volanda Napoleon, MD 8/20/20198:57 AM

## 2018-06-21 ENCOUNTER — Inpatient Hospital Stay: Payer: BLUE CROSS/BLUE SHIELD

## 2018-06-21 VITALS — BP 134/71 | HR 59 | Temp 98.6°F | Resp 17

## 2018-06-21 DIAGNOSIS — D500 Iron deficiency anemia secondary to blood loss (chronic): Secondary | ICD-10-CM

## 2018-06-21 DIAGNOSIS — D509 Iron deficiency anemia, unspecified: Secondary | ICD-10-CM | POA: Diagnosis not present

## 2018-06-21 MED ORDER — SODIUM CHLORIDE 0.9 % IV SOLN
510.0000 mg | Freq: Once | INTRAVENOUS | Status: AC
  Administered 2018-06-21: 510 mg via INTRAVENOUS
  Filled 2018-06-21: qty 17

## 2018-06-21 MED ORDER — SODIUM CHLORIDE 0.9 % IV SOLN
Freq: Once | INTRAVENOUS | Status: AC
  Administered 2018-06-21: 12:00:00 via INTRAVENOUS
  Filled 2018-06-21: qty 250

## 2018-07-19 ENCOUNTER — Inpatient Hospital Stay: Payer: BLUE CROSS/BLUE SHIELD

## 2018-07-19 ENCOUNTER — Inpatient Hospital Stay: Payer: BLUE CROSS/BLUE SHIELD | Attending: Hematology & Oncology | Admitting: Hematology & Oncology

## 2018-09-18 ENCOUNTER — Inpatient Hospital Stay: Payer: BLUE CROSS/BLUE SHIELD | Attending: Hematology & Oncology | Admitting: Family

## 2018-09-18 ENCOUNTER — Inpatient Hospital Stay: Payer: BLUE CROSS/BLUE SHIELD

## 2018-09-18 ENCOUNTER — Telehealth: Payer: Self-pay | Admitting: Hematology & Oncology

## 2018-09-18 VITALS — BP 145/77 | HR 58 | Temp 97.9°F | Resp 18 | Ht 74.0 in | Wt 309.8 lb

## 2018-09-18 DIAGNOSIS — D509 Iron deficiency anemia, unspecified: Secondary | ICD-10-CM | POA: Insufficient documentation

## 2018-09-18 DIAGNOSIS — D500 Iron deficiency anemia secondary to blood loss (chronic): Secondary | ICD-10-CM

## 2018-09-18 LAB — CBC WITH DIFFERENTIAL (CANCER CENTER ONLY)
Abs Immature Granulocytes: 0.01 10*3/uL (ref 0.00–0.07)
Basophils Absolute: 0 10*3/uL (ref 0.0–0.1)
Basophils Relative: 1 %
Eosinophils Absolute: 0.2 10*3/uL (ref 0.0–0.5)
Eosinophils Relative: 7 %
HCT: 31.7 % — ABNORMAL LOW (ref 39.0–52.0)
Hemoglobin: 9.7 g/dL — ABNORMAL LOW (ref 13.0–17.0)
Immature Granulocytes: 0 %
Lymphocytes Relative: 21 %
Lymphs Abs: 0.7 10*3/uL (ref 0.7–4.0)
MCH: 26.3 pg (ref 26.0–34.0)
MCHC: 30.6 g/dL (ref 30.0–36.0)
MCV: 85.9 fL (ref 80.0–100.0)
Monocytes Absolute: 0.3 10*3/uL (ref 0.1–1.0)
Monocytes Relative: 10 %
Neutro Abs: 2 10*3/uL (ref 1.7–7.7)
Neutrophils Relative %: 61 %
Platelet Count: 139 10*3/uL — ABNORMAL LOW (ref 150–400)
RBC: 3.69 MIL/uL — ABNORMAL LOW (ref 4.22–5.81)
RDW: 14.6 % (ref 11.5–15.5)
WBC Count: 3.2 10*3/uL — ABNORMAL LOW (ref 4.0–10.5)
nRBC: 0 % (ref 0.0–0.2)

## 2018-09-18 LAB — RETICULOCYTES
Immature Retic Fract: 14.7 % (ref 2.3–15.9)
RBC.: 3.69 MIL/uL — ABNORMAL LOW (ref 4.22–5.81)
Retic Count, Absolute: 78.6 10*3/uL (ref 19.0–186.0)
Retic Ct Pct: 2.1 % (ref 0.4–3.1)

## 2018-09-18 LAB — CMP (CANCER CENTER ONLY)
ALT: 24 U/L (ref 0–44)
AST: 52 U/L — ABNORMAL HIGH (ref 15–41)
Albumin: 3.9 g/dL (ref 3.5–5.0)
Alkaline Phosphatase: 184 U/L — ABNORMAL HIGH (ref 38–126)
Anion gap: 9 (ref 5–15)
BUN: 10 mg/dL (ref 6–20)
CO2: 25 mmol/L (ref 22–32)
Calcium: 9.6 mg/dL (ref 8.9–10.3)
Chloride: 107 mmol/L (ref 98–111)
Creatinine: 0.96 mg/dL (ref 0.61–1.24)
GFR, Est AFR Am: >60 mL/min (ref >60)
GFR, Estimated: >60 mL/min (ref >60)
Glucose, Bld: 103 mg/dL — ABNORMAL HIGH (ref 70–99)
Potassium: 4.2 mmol/L (ref 3.5–5.1)
Sodium: 141 mmol/L (ref 135–145)
Total Bilirubin: 1.3 mg/dL — ABNORMAL HIGH (ref 0.3–1.2)
Total Protein: 8.2 g/dL — ABNORMAL HIGH (ref 6.5–8.1)

## 2018-09-18 LAB — SAVE SMEAR(SSMR), FOR PROVIDER SLIDE REVIEW

## 2018-09-18 NOTE — Telephone Encounter (Signed)
Order for [place] has been cancelled per 11/18 staff message

## 2018-09-18 NOTE — Progress Notes (Signed)
Hematology and Oncology Follow Up Visit  Marcus Torres [id number] [birth year] 42 y.o. 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 He is feeling fatigued and has noted occasional palpitations. His Hgb is better at 9.7 with an MCV of 85. Iron studies are pending.  He states that he has not consumed any alcohol in 2 weeks. He states that he saw his PCP yesterday and will be starting treatment with Naltrexone to help prevent relapse.   No episodes of bleeding to report. No bruising or petechiae.  No fever, chills, n./v, cough, rash, iIlness, SOB, chest pain, palpitations, abdominal pain or changes in bowel or bladder habits.  He has numbness and tingling in his toes that is unchanged.  No swelling or tenderness in his extremities.  No falls or syncopal episodes.  No lymphadenopathy.  He has maintained a good appetite and is staying well hydrated. His weight is stable.   ECOG Performance Status: 1 - Symptomatic but completely ambulatory  Medications:  Allergies as of 09/18/2018      Reactions   Lisinopril    Lisinopril-hydrochlorothiazide    REACTION: cough   Losartan Cough   Lisinopril-hydrochlorothiazide Cough   REACTION: cough      Medication List        Accurate as of 09/18/18  1:50 PM. Always use your most recent med list.          AMBULATORY NON FORMULARY MEDICATION Medication Name:CPAP machine and supplies with humidifier. Set to 15 cm of water pressure. Dx: G 47.33 severe OSA. Fax to [facility]   FLUoxetine 20 MG capsule Commonly known as:  PROZAC Take 20 mg by mouth daily.   hydrOXYzine 25 MG capsule Commonly known as:  VISTARIL Take 25 mg by mouth.   metoprolol tartrate 50 MG tablet Commonly known as:  LOPRESSOR TAKE ONE TABLET BY MOUTH TWO TIMES A DAY   naltrexone 50 MG  tablet Commonly known as:  DEPADE Take 50 mg by mouth daily.   nitroGLYCERIN 0.4 MG SL tablet Commonly known as:  NITROSTAT Place 1 tablet (0.4 mg total) under the tongue every 5 (five) minutes as needed for chest pain.   TRIBENZOR 40-5-25 MG Tabs Generic drug:  Olmesartan-amLODIPine-HCTZ TAKE 1 TABLET BY MOUTH EVERY DAY       Allergies:  Allergies  Allergen Reactions  . Lisinopril   . Lisinopril-Hydrochlorothiazide     REACTION: cough  . Losartan Cough  . Lisinopril-Hydrochlorothiazide Cough    REACTION: cough    Past Medical History, Surgical history, Social history, and Family History were reviewed and updated.  Review of Systems: All other 10 point review of systems is negative.   Physical Exam:  vitals were not taken for this visit.   Wt Readings from Last 3 Encounters:  06/14/18 (!) 343 lb (155.6 kg)  05/16/18 (!) 325 lb 6.4 oz (147.6 kg)  03/27/18 (!) 354 lb (160.6 kg)    Ocular: Sclerae unicteric, pupils equal, round and reactive to light Ear-nose-throat: Oropharynx clear, dentition fair Lymphatic: No cervical, supraclavicular or axillary adenopathy Lungs no rales or rhonchi, good excursion bilaterally Heart regular rate and rhythm, no murmur appreciated Abd soft, nontender, positive bowel sounds, no liver or spleen tip palpated on exam, no fluid wave  MSK no focal spinal tenderness, no joint edema Neuro: non-focal, well-oriented, appropriate affect Breasts:  Deferred   Lab Results  Component Value Date   WBC 2.6 (L) 06/14/2018   HGB 8.3 (L) 06/14/2018   HCT 27.4 (L) 06/14/2018   MCV 85.9 06/14/2018   PLT 117 (L) 06/14/2018   Lab Results  Component Value Date   FERRITIN 14 (L) 06/14/2018   IRON 22 (L) 06/14/2018   TIBC 494 (H) 06/14/2018   UIBC 472 06/14/2018   IRONPCTSAT 4 (L) 06/14/2018   Lab Results  Component Value Date   RETICCTPCT 1.0 05/16/2018   RBC 3.19 (L) 06/14/2018   No results found for: KPAFRELGTCHN, LAMBDASER,  KAPLAMBRATIO No results found for: IGGSERUM, IGA, IGMSERUM No results found for: Ronnald Ramp, A1GS, A2GS, Violet Baldy, MSPIKE, SPEI   Chemistry      Component Value Date/Time   NA 141 06/14/2018 1027   K 3.9 06/14/2018 1027   CL 107 06/14/2018 1027   CO2 24 06/14/2018 1027   BUN 8 06/14/2018 1027   CREATININE 0.92 06/14/2018 1027   CREATININE 1.02 10/17/2017 1154      Component Value Date/Time   CALCIUM 8.8 (L) 06/14/2018 1027   ALKPHOS 189 (H) 06/14/2018 1027   AST 63 (H) 06/14/2018 1027   ALT 33 06/14/2018 1027   BILITOT 1.5 (H) 06/14/2018 1027       Impression and Plan: Marcus Torres is a very pleasant 42 yo caucasian gentleman with multifactorial anemia. He is symptomatic as mentioned above. Hgb is improved at 9.9.  We will see what his iron studies show and bring him back in for either IV iron or Aranesp.  We will go ahead and plan to see him again in 3 months.  She will contact our office with any questions or concerns. We can certainly see him sooner if need be.   Laverna Peace, NP 11/19/20191:50 PM

## 2018-09-19 LAB — IRON AND TIBC
Iron: 28 ug/dL — ABNORMAL LOW (ref 42–163)
Saturation Ratios: 6 % — ABNORMAL LOW (ref 20–55)
TIBC: 499 ug/dL — ABNORMAL HIGH (ref 202–409)
UIBC: 471 ug/dL — ABNORMAL HIGH (ref 117–376)

## 2018-09-19 LAB — FERRITIN: Ferritin: 17 ng/mL — ABNORMAL LOW (ref 24–336)

## 2018-09-24 ENCOUNTER — Ambulatory Visit: Payer: Self-pay

## 2018-09-24 ENCOUNTER — Inpatient Hospital Stay: Payer: BLUE CROSS/BLUE SHIELD

## 2018-09-24 VITALS — BP 134/70 | HR 58 | Temp 97.7°F | Resp 17

## 2018-09-24 DIAGNOSIS — D509 Iron deficiency anemia, unspecified: Secondary | ICD-10-CM | POA: Diagnosis not present

## 2018-09-24 DIAGNOSIS — D500 Iron deficiency anemia secondary to blood loss (chronic): Secondary | ICD-10-CM

## 2018-09-24 MED ORDER — SODIUM CHLORIDE 0.9 % IV SOLN
510.0000 mg | Freq: Once | INTRAVENOUS | Status: AC
  Administered 2018-09-24: 510 mg via INTRAVENOUS
  Filled 2018-09-24: qty 17

## 2018-09-24 MED ORDER — SODIUM CHLORIDE 0.9 % IV SOLN
INTRAVENOUS | Status: DC
  Administered 2018-09-24: 13:00:00 via INTRAVENOUS
  Filled 2018-09-24: qty 250

## 2018-09-24 NOTE — Patient Instructions (Signed)

## 2018-09-24 NOTE — Progress Notes (Signed)
Patient does not want to stay for 30 minute post Iron infusion observation. Patient discharged ambulatory without complaints or concerns.  

## 2018-10-01 ENCOUNTER — Inpatient Hospital Stay: Payer: BLUE CROSS/BLUE SHIELD | Attending: Hematology & Oncology

## 2018-10-01 ENCOUNTER — Other Ambulatory Visit: Payer: Self-pay

## 2018-10-01 VITALS — BP 145/77 | HR 62 | Temp 97.9°F | Resp 18

## 2018-10-01 DIAGNOSIS — D500 Iron deficiency anemia secondary to blood loss (chronic): Secondary | ICD-10-CM

## 2018-10-01 DIAGNOSIS — D509 Iron deficiency anemia, unspecified: Secondary | ICD-10-CM | POA: Diagnosis not present

## 2018-10-01 MED ORDER — SODIUM CHLORIDE 0.9 % IV SOLN
Freq: Once | INTRAVENOUS | Status: AC
  Administered 2018-10-01: 13:00:00 via INTRAVENOUS
  Filled 2018-10-01: qty 250

## 2018-10-01 MED ORDER — SODIUM CHLORIDE 0.9 % IV SOLN
Freq: Once | INTRAVENOUS | Status: DC
  Filled 2018-10-01: qty 250

## 2018-10-01 MED ORDER — SODIUM CHLORIDE 0.9 % IV SOLN
510.0000 mg | Freq: Once | INTRAVENOUS | Status: AC
  Administered 2018-10-01: 510 mg via INTRAVENOUS
  Filled 2018-10-01: qty 17

## 2018-10-01 NOTE — Progress Notes (Signed)
Patient does not want to stay for 30 minute post Iron observation. VSS. Patient discharged ambulatory without complaints or concerns.

## 2018-10-01 NOTE — Patient Instructions (Signed)

## 2018-11-14 ENCOUNTER — Encounter: Payer: Self-pay | Admitting: Osteopathic Medicine

## 2018-11-14 ENCOUNTER — Ambulatory Visit (INDEPENDENT_AMBULATORY_CARE_PROVIDER_SITE_OTHER): Payer: 59 | Admitting: Osteopathic Medicine

## 2018-11-14 VITALS — BP 135/74 | HR 67 | Temp 98.2°F | Wt 322.5 lb

## 2018-11-14 DIAGNOSIS — M7022 Olecranon bursitis, left elbow: Secondary | ICD-10-CM

## 2018-11-14 MED ORDER — CLINDAMYCIN HCL 300 MG PO CAPS: 300.0000 mg | ORAL_CAPSULE | Freq: Three times a day (TID) | ORAL | 0 refills | Status: DC

## 2018-11-14 NOTE — Progress Notes (Signed)
HPI: Marcus Torres is a 43 y.o. male who  has a past medical history of Alcohol abuse, Elevated liver enzymes, Hypertension, and Normal cardiac stress test (05/09/2011).  he presents to [facility] today, 11/14/18,  for chief complaint of:  Elbow issue  . Context: past trauma, fell on floor 2 years ago, this feels about the same.  . Location: L elbow . Quality: lump, nonpainful, redness and scabbing (he reports he's pulled the skin off)  . Duration: 2 weeks   Appreciate phone consult from Dr Georgina Snell - pt will see him tomorrow for drainage under ultrasound.     At today's visit... Past medical history, surgical history, and family history reviewed and updated as needed.  Current medication list and allergy/intolerance information reviewed and updated as needed. (See remainder of HPI, ROS, Phys Exam below)         ASSESSMENT/PLAN: The encounter diagnosis was Olecranon bursitis of left elbow.   Possible cellulitis, will cover for MRSA, depending on fluid drained tomorrow could consider continue vs deescalate vs stop.        Follow-up plan: Return for Dr Georgina Snell tomorrow for drainage, will not charge for today's visit. .                             ############################################ ############################################ ############################################ ############################################    No outpatient medications have been marked as taking for the 11/14/18 encounter (Appointment) with Emeterio Reeve, DO.    Allergies  Allergen Reactions  . Lisinopril Cough  . Lisinopril-Hydrochlorothiazide Cough  . Losartan Cough       Review of Systems:  Constitutional: No recent illness  HEENT: No  headache, no vision change  Cardiac: No  chest pain, No  pressure  Musculoskeletal: No new myalgia/arthralgia  Skin: +Rash   Exam:  BP 135/74 (BP Location: Left Arm,  Patient Position: Sitting, Cuff Size: Large)   Pulse 67   Temp 98.2 F (36.8 C) (Oral)   Wt (!) 322 lb 8 oz (146.3 kg)   BMI 41.41 kg/m   Constitutional: VS see above. General Appearance: alert, well-developed, well-nourished, NAD  Respiratory: Normal respiratory effort.  Musculoskeletal: Gait normal. Symmetric and independent movement of all extremities  Neurological: Normal balance/coordination. No tremor.  Skin: warm, dry. Erythematous, fluctuant mass over L elbow.   Psychiatric: Normal judgment/insight. Normal mood and affect. Oriented x3.       Visit summary with medication list and pertinent instructions was printed for patient to review, patient was advised to alert [place] if any updates are needed. All questions at time of visit were answered - patient instructed to contact office with any additional concerns. ER/RTC precautions were reviewed with the patient and understanding verbalized.      Please note: voice recognition software was used to produce this document, and typos may escape review. Please contact Dr. Sheppard Coil for any needed clarifications.    Follow up plan: Return for Dr Georgina Snell tomorrow for drainage, will not charge for today's visit. Marland Kitchen

## 2018-11-15 ENCOUNTER — Ambulatory Visit (INDEPENDENT_AMBULATORY_CARE_PROVIDER_SITE_OTHER): Payer: 59 | Admitting: Family Medicine

## 2018-11-15 ENCOUNTER — Encounter: Payer: Self-pay | Admitting: Family Medicine

## 2018-11-15 VITALS — BP 144/71 | HR 65 | Ht 74.0 in | Wt 325.0 lb

## 2018-11-15 DIAGNOSIS — M7022 Olecranon bursitis, left elbow: Secondary | ICD-10-CM

## 2018-11-15 DIAGNOSIS — L03114 Cellulitis of left upper limb: Secondary | ICD-10-CM

## 2018-11-15 NOTE — Patient Instructions (Signed)
Thank you for coming in today.  Take the clindamycin as directed.  Apply the antibiotic ointment.  Use a soft elbow pad to protect the elbow.  Recheck in 1-2 weeks.  If skin is less red and swelling still present we can drain it.    Cellulitis, Adult  Cellulitis is a skin infection. The infected area is usually warm, red, swollen, and tender. This condition occurs most often in the arms and lower legs. The infection can travel to the muscles, blood, and underlying tissue and become serious. It is very important to get treated for this condition. What are the causes? Cellulitis is caused by bacteria. The bacteria enter through a break in the skin, such as a cut, burn, insect bite, open sore, or crack. What increases the risk? This condition is more likely to occur in people who:  Have a weak body defense system (immune system).  Have open wounds on the skin, such as cuts, burns, bites, and scrapes. Bacteria can enter the body through these open wounds.  Are older than 45 years of age.  Have diabetes.  Have a type of long-lasting (chronic) liver disease (cirrhosis) or kidney disease.  Are obese.  Have a skin condition such as: ? Itchy rash (eczema). ? Slow movement of blood in the veins (venous stasis). ? Fluid buildup below the skin (edema).  Have had radiation therapy.  Use IV drugs. What are the signs or symptoms? Symptoms of this condition include:  Redness, streaking, or spotting on the skin.  Swollen area of the skin.  Tenderness or pain when an area of the skin is touched.  Warm skin.  A fever.  Chills.  Blisters. How is this diagnosed? This condition is diagnosed based on a medical history and physical exam. You may also have tests, including:  Blood tests.  Imaging tests. How is this treated? Treatment for this condition may include:  Medicines, such as antibiotic medicines or medicines to treat allergies (antihistamines).  Supportive care, such  as rest and application of cold or warm cloths (compresses) to the skin.  Hospital care, if the condition is severe. The infection usually starts to get better within 1-2 days of treatment. Follow these instructions at home:  Medicines  Take over-the-counter and prescription medicines only as told by your health care provider.  If you were prescribed an antibiotic medicine, take it as told by your health care provider. Do not stop taking the antibiotic even if you start to feel better. General instructions  Drink enough fluid to keep your urine pale yellow.  Do not touch or rub the infected area.  Raise (elevate) the infected area above the level of your heart while you are sitting or lying down.  Apply warm or cold compresses to the affected area as told by your health care provider.  Keep all follow-up visits as told by your health care provider. This is important. These visits let your health care provider make sure a more serious infection is not developing. Contact a health care provider if:  You have a fever.  Your symptoms do not begin to improve within 1-2 days of starting treatment.  Your bone or joint underneath the infected area becomes painful after the skin has healed.  Your infection returns in the same area or another area.  You notice a swollen bump in the infected area.  You develop new symptoms.  You have a general ill feeling (malaise) with muscle aches and pains. Get help right away if:  Your symptoms get worse.  You feel very sleepy.  You develop vomiting or diarrhea that persists.  You notice red streaks coming from the infected area.  Your red area gets larger or turns dark in color. These symptoms may represent a serious problem that is an emergency. Do not wait to see if the symptoms will go away. Get medical help right away. Call your local emergency services (911 in the [place] Do not drive yourself to the hospital. Summary  Cellulitis is a  skin infection. This condition occurs most often in the arms and lower legs.  Treatment for this condition may include medicines, such as antibiotic medicines or antihistamines.  Take over-the-counter and prescription medicines only as told by your health care provider. If you were prescribed an antibiotic medicine, do not stop taking the antibiotic even if you start to feel better.  Contact a health care provider if your symptoms do not begin to improve within 1-2 days of starting treatment or your symptoms get worse.  Keep all follow-up visits as told by your health care provider. This is important. These visits let your health care provider make sure that a more serious infection is not developing. This information is not intended to replace advice given to you by your health care provider. Make sure you discuss any questions you have with your health care provider. Document Released: 07/27/2005 Document Revised: 03/08/2018 Document Reviewed: 03/08/2018 Elsevier Interactive Patient Education  2019 [employer].

## 2018-11-16 NOTE — Progress Notes (Signed)
Marcus Torres is a 43 y.o. male who presents to [facility] today for left elbow swelling.  Patient was seen by Dr. Sheppard Coil on January 15 for left elbow swelling with redness.  He was thought to perhaps have cellulitis over top of olecranon bursitis.  He is here today for evaluation.  He notes occurring over the last 2 weeks swelling at the left olecranon.  He fell 2 years ago landing on his elbow and has had swelling off and on since.  He notes the skin became red and irritated more recently after he started picking at his skin on his elbow.  He was seen yesterday and thought to have cellulitis overlying his olecranon bursitis and prescribed clindamycin.  He was unable to pick up the antibiotic until this morning and has not yet started taking it.  He notes the swelling and redness has extended.  He denies significant pain fevers or chills.  He denies any drainage.  He feels well otherwise.    ROS:  As above  Exam:  BP (!) 144/71   Pulse 65   Ht 6\' 2"  (1.88 m)   Wt (!) 325 lb (147.4 kg)   BMI 41.73 kg/m  Wt Readings from Last 5 Encounters:  11/15/18 (!) 325 lb (147.4 kg)  11/14/18 (!) 322 lb 8 oz (146.3 kg)  09/18/18 (!) 309 lb 12.8 oz (140.5 kg)  06/14/18 (!) 343 lb (155.6 kg)  05/16/18 (!) 325 lb 6.4 oz (147.6 kg)   General: Well Developed, well nourished, and in no acute distress.  Neuro/Psych: Alert and oriented x3, extra-ocular muscles intact, able to move all 4 extremities, sensation grossly intact. Skin: Warm and dry, no rashes noted.  Respiratory: Not using accessory muscles, speaking in full sentences, trachea midline.  Cardiovascular: Pulses palpable, no extremity edema. Abdomen: Does not appear distended. MSK: Left elbow swelling at olecranon with some soft nontender fluctuance.  The skin overlying the olecranon is erythematous extending down the area of fluctuance.  No expressible pus.    Lab and Radiology Results No  results found for this or any previous visit (from the past 72 hour(s)). No results found.     Assessment and Plan: 43 y.o. male with left elbow olecranon bursitis with overlying cellulitis.  I am not convinced that the underlying olecranon bursitis has become septic as is not particularly tender.  I do think he has cellulitis however overlying the bursitis.  I am concerned that attempting to drain the olecranon bursitis today would result in almost certain bacterial seeding of the olecranon bursa causing worsening septic bursitis potentially.  Agree with treatment with clindamycin.  Recommend padding and some antibiotic ointment as well.  Recheck in 1 to 2 weeks.  Once cellulitis resolved if bursitis still present would proceed with aspiration and potentially injection at that time.  PDMP not reviewed this encounter. No orders of the defined types were placed in this encounter.  No orders of the defined types were placed in this encounter.   Historical information moved to improve visibility of documentation.  Past Medical History:  Diagnosis Date  . Alcohol abuse   . Elevated liver enzymes   . Hypertension   . Normal cardiac stress test 05/09/2011   Negative treadmill performed at [facility] cardiology   Past Surgical History:  Procedure Laterality Date  . ARTHROSCOPIC REPAIR ACL  2016   Dr. Annie Main Pill    Social History   Tobacco Use  . Smoking status:  Former Smoker    Packs/day: 0.25    Types: Cigarettes  . Smokeless tobacco: Never Used  Substance Use Topics  . Alcohol use: Yes    Alcohol/week: 28.0 standard drinks    Types: 28 Standard drinks or equivalent per week    Comment: per week   family history includes Hyperlipidemia in his mother; Hypertension in his mother; Other in an other family member.  Medications: Current Outpatient Medications  Medication Sig Dispense Refill  . AMBULATORY NON FORMULARY MEDICATION Medication Name:CPAP machine and supplies with  humidifier. Set to 15 cm of water pressure. Dx: G 47.33 severe OSA. Fax to [facility] 1 Units 0  . clindamycin (CLEOCIN) 300 MG capsule Take 1 capsule (300 mg total) by mouth 3 (three) times daily. 21 capsule 0  . FLUoxetine (PROZAC) 20 MG capsule Take 20 mg by mouth daily.    . hydrOXYzine (VISTARIL) 25 MG capsule Take 25 mg by mouth.  0  . metoprolol tartrate (LOPRESSOR) 50 MG tablet TAKE ONE TABLET BY MOUTH TWO TIMES A DAY 60 tablet 3  . naltrexone (DEPADE) 50 MG tablet Take 50 mg by mouth daily.    . nitroGLYCERIN (NITROSTAT) 0.4 MG SL tablet Place 1 tablet (0.4 mg total) under the tongue every 5 (five) minutes as needed for chest pain. 12 tablet 3  . TRIBENZOR 40-5-25 MG TABS TAKE 1 TABLET BY MOUTH EVERY DAY 90 tablet 0   No current facility-administered medications for this visit.    Allergies  Allergen Reactions  . Lisinopril Cough  . Lisinopril-Hydrochlorothiazide Cough  . Losartan Cough      Discussed warning signs or symptoms. Please see discharge instructions. Patient expresses understanding.

## 2018-11-29 ENCOUNTER — Ambulatory Visit (INDEPENDENT_AMBULATORY_CARE_PROVIDER_SITE_OTHER): Payer: 59 | Admitting: Family Medicine

## 2018-11-29 ENCOUNTER — Encounter: Payer: Self-pay | Admitting: Family Medicine

## 2018-11-29 VITALS — BP 160/79 | HR 68 | Ht 74.0 in | Wt 331.0 lb

## 2018-11-29 DIAGNOSIS — M7022 Olecranon bursitis, left elbow: Secondary | ICD-10-CM | POA: Diagnosis not present

## 2018-11-29 NOTE — Patient Instructions (Signed)
Thank you for coming in today. Keep the elbow padded.  If worse redness and pain we will drain it.  We can inject it if not getting better.   Padding is really important.   Recheck as needed.

## 2018-11-29 NOTE — Progress Notes (Signed)
Marcus Torres is a 43 y.o. male who presents to [place] today for follow-up left elbow olecranon bursitis/cellulitis.  Marcus Torres was seen previously about 2 weeks ago for cellulitis of the left elbow overlying likely noninfected olecranon bursitis.  He was treated conservatively with oral antibiotics.  He returns today noting considerable improvement.  He notes much less redness and swelling.  He notes a small nodule at the tip of his elbow at the olecranon that still present.  He denies significant pain fevers or chills.  He notes that he is not using an elbow pad and does occasionally bump or press on his elbow.  Patient finished his antibiotic course about 5 days ago and notes that it is not worsening at all.    ROS:  As above  Exam:  BP (!) 160/79   Pulse 68   Ht 6\' 2"  (1.88 m)   Wt (!) 331 lb (150.1 kg)   BMI 42.50 kg/m  Wt Readings from Last 5 Encounters:  11/29/18 (!) 331 lb (150.1 kg)  11/15/18 (!) 325 lb (147.4 kg)  11/14/18 (!) 322 lb 8 oz (146.3 kg)  09/18/18 (!) 309 lb 12.8 oz (140.5 kg)  06/14/18 (!) 343 lb (155.6 kg)   General: Well Developed, well nourished, and in no acute distress.  Neuro/Psych: Alert and oriented x3, extra-ocular muscles intact, able to move all 4 extremities, sensation grossly intact. Skin: Warm and dry, no rashes noted.  Respiratory: Not using accessory muscles, speaking in full sentences, trachea midline.  Cardiovascular: Pulses palpable, no extremity edema. Abdomen: Does not appear distended. MSK: Left elbow: Slight swelling at the olecranon with minimal erythema.  Nontender no fluctuance palpated.    Lab and Radiology Results No results found for this or any previous visit (from the past 72 hour(s)). No results found.     Assessment and Plan: 43 y.o. male with left elbow cellulitis likely resolve now.  Watchful waiting recheck as needed.  Left elbow olecranon bursitis: Still mildly present  without a lot of fluid accumulation today.  We discussed options.  Patient elects to attempt more conservative management with elbow padding and proceed with injection if not improved.   PDMP not reviewed this encounter. No orders of the defined types were placed in this encounter.  No orders of the defined types were placed in this encounter.   Historical information moved to improve visibility of documentation.  Past Medical History:  Diagnosis Date  . Alcohol abuse   . Elevated liver enzymes   . Hypertension   . Normal cardiac stress test 05/09/2011   Negative treadmill performed at [facility] cardiology   Past Surgical History:  Procedure Laterality Date  . ARTHROSCOPIC REPAIR ACL  2016   Dr. Annie Main Pill    Social History   Tobacco Use  . Smoking status: Former Smoker    Packs/day: 0.25    Types: Cigarettes  . Smokeless tobacco: Never Used  Substance Use Topics  . Alcohol use: Yes    Alcohol/week: 28.0 standard drinks    Types: 28 Standard drinks or equivalent per week    Comment: per week   family history includes Hyperlipidemia in his mother; Hypertension in his mother; Other in an other family member.  Medications: Current Outpatient Medications  Medication Sig Dispense Refill  . AMBULATORY NON FORMULARY MEDICATION Medication Name:CPAP machine and supplies with humidifier. Set to 15 cm of water pressure. Dx: G 47.33 severe OSA. Fax to [facility] 1  Units 0  . FLUoxetine (PROZAC) 20 MG capsule Take 20 mg by mouth daily.    . hydrOXYzine (VISTARIL) 25 MG capsule Take 25 mg by mouth.  0  . metoprolol tartrate (LOPRESSOR) 50 MG tablet TAKE ONE TABLET BY MOUTH TWO TIMES A DAY 60 tablet 3  . naltrexone (DEPADE) 50 MG tablet Take 50 mg by mouth daily.    . nitroGLYCERIN (NITROSTAT) 0.4 MG SL tablet Place 1 tablet (0.4 mg total) under the tongue every 5 (five) minutes as needed for chest pain. 12 tablet 3  . TRIBENZOR 40-5-25 MG TABS TAKE 1 TABLET BY MOUTH  EVERY DAY 90 tablet 0   No current facility-administered medications for this visit.    Allergies  Allergen Reactions  . Lisinopril Cough  . Lisinopril-Hydrochlorothiazide Cough  . Losartan Cough      Discussed warning signs or symptoms. Please see discharge instructions. Patient expresses understanding.

## 2018-12-19 ENCOUNTER — Ambulatory Visit: Payer: Self-pay | Admitting: Hematology & Oncology

## 2018-12-19 ENCOUNTER — Other Ambulatory Visit: Payer: Self-pay

## 2019-01-23 LAB — HM HEPATITIS C SCREENING LAB: HM Hepatitis Screen: NEGATIVE

## 2019-01-24 LAB — CHG IAAD IA CLOSTRIDIUM DIFFICILE TOXIN: C Diff antigen: POSITIVE

## 2019-01-25 LAB — HEPATIC FUNCTION PANEL
ALT: 41 — AB (ref 10–40)
AST: 176 — AB (ref 14–40)
Alkaline Phosphatase: 199 — AB (ref 25–125)
Bilirubin, Total: 4

## 2019-01-26 LAB — BASIC METABOLIC PANEL
BUN: 12 (ref 4–21)
Creatinine: 0.7 (ref 0.6–1.3)
Glucose: 78
Potassium: 3.7 (ref 3.4–5.3)
Sodium: 134 — AB (ref 137–147)

## 2019-01-26 LAB — CBC AND DIFFERENTIAL: Hemoglobin: 10.9 — AB (ref 13.5–17.5)

## 2019-01-26 MED ORDER — FOLIC ACID 1 MG PO TABS
1.00 | ORAL_TABLET | ORAL | Status: DC
Start: 2019-01-27 — End: 2019-01-26

## 2019-01-26 MED ORDER — HYDRALAZINE HCL 25 MG PO TABS
12.50 | ORAL_TABLET | ORAL | Status: DC
Start: 2019-01-26 — End: 2019-01-26

## 2019-01-26 MED ORDER — LACTULOSE 10 GM/15ML PO SOLN
10.00 | ORAL | Status: DC
Start: 2019-01-26 — End: 2019-01-26

## 2019-01-26 MED ORDER — GENERIC EXTERNAL MEDICATION
100.00 | Status: DC
Start: 2019-01-27 — End: 2019-01-26

## 2019-01-26 MED ORDER — LORAZEPAM 1 MG PO TABS
2.00 | ORAL_TABLET | ORAL | Status: DC
Start: ? — End: 2019-01-26

## 2019-01-26 MED ORDER — ONDANSETRON 4 MG PO TBDP
4.00 | ORAL_TABLET | ORAL | Status: DC
Start: ? — End: 2019-01-26

## 2019-01-26 MED ORDER — HYDRALAZINE HCL 25 MG PO TABS
25.00 | ORAL_TABLET | ORAL | Status: DC
Start: ? — End: 2019-01-26

## 2019-01-26 MED ORDER — ALPRAZOLAM 0.5 MG PO TABS
.50 | ORAL_TABLET | ORAL | Status: DC
Start: ? — End: 2019-01-26

## 2019-01-26 MED ORDER — LORAZEPAM 2 MG/ML IJ SOLN
2.00 | INTRAMUSCULAR | Status: DC
Start: ? — End: 2019-01-26

## 2019-01-26 MED ORDER — PANTOPRAZOLE SODIUM 40 MG PO TBEC
40.00 | DELAYED_RELEASE_TABLET | ORAL | Status: DC
Start: 2019-01-27 — End: 2019-01-26

## 2019-01-26 MED ORDER — GENERIC EXTERNAL MEDICATION
75.00 | Status: DC
Start: 2019-01-26 — End: 2019-01-26

## 2019-01-26 MED ORDER — GENERIC EXTERNAL MEDICATION
1.00 | Status: DC
Start: 2019-01-27 — End: 2019-01-26

## 2019-01-26 MED ORDER — ALUMINUM-MAGNESIUM-SIMETHICONE 200-200-20 MG/5ML PO SUSP
30.00 | ORAL | Status: DC
Start: ? — End: 2019-01-26

## 2019-01-26 MED ORDER — GENERIC EXTERNAL MEDICATION
2.00 | Status: DC
Start: ? — End: 2019-01-26

## 2019-01-26 MED ORDER — FLUOXETINE HCL 20 MG PO CAPS
20.00 | ORAL_CAPSULE | ORAL | Status: DC
Start: 2019-01-27 — End: 2019-01-26

## 2019-01-26 MED ORDER — ACETAMINOPHEN 325 MG PO TABS
650.00 | ORAL_TABLET | ORAL | Status: DC
Start: ? — End: 2019-01-26

## 2019-01-26 MED ORDER — DOCUSATE SODIUM 100 MG PO CAPS
100.00 | ORAL_CAPSULE | ORAL | Status: DC
Start: ? — End: 2019-01-26

## 2019-01-26 MED ORDER — AMLODIPINE BESYLATE 5 MG PO TABS
10.00 | ORAL_TABLET | ORAL | Status: DC
Start: 2019-01-27 — End: 2019-01-26

## 2019-01-26 MED ORDER — HYDROCHLOROTHIAZIDE 25 MG PO TABS
25.00 | ORAL_TABLET | ORAL | Status: DC
Start: 2019-01-27 — End: 2019-01-26

## 2019-01-28 ENCOUNTER — Other Ambulatory Visit: Payer: Self-pay | Admitting: Family Medicine

## 2019-01-28 DIAGNOSIS — I10 Essential (primary) hypertension: Secondary | ICD-10-CM

## 2019-01-30 ENCOUNTER — Other Ambulatory Visit: Payer: Self-pay

## 2019-01-30 ENCOUNTER — Ambulatory Visit (INDEPENDENT_AMBULATORY_CARE_PROVIDER_SITE_OTHER): Payer: 59

## 2019-01-30 ENCOUNTER — Ambulatory Visit (INDEPENDENT_AMBULATORY_CARE_PROVIDER_SITE_OTHER): Payer: 59 | Admitting: Family Medicine

## 2019-01-30 ENCOUNTER — Telehealth (INDEPENDENT_AMBULATORY_CARE_PROVIDER_SITE_OTHER): Payer: 59 | Admitting: Family Medicine

## 2019-01-30 ENCOUNTER — Encounter: Payer: Self-pay | Admitting: Family Medicine

## 2019-01-30 VITALS — BP 132/71 | HR 63 | Temp 98.1°F | Wt 337.0 lb

## 2019-01-30 DIAGNOSIS — M25522 Pain in left elbow: Secondary | ICD-10-CM

## 2019-01-30 DIAGNOSIS — M7022 Olecranon bursitis, left elbow: Secondary | ICD-10-CM

## 2019-01-30 DIAGNOSIS — R748 Abnormal levels of other serum enzymes: Secondary | ICD-10-CM | POA: Diagnosis not present

## 2019-01-30 DIAGNOSIS — R768 Other specified abnormal immunological findings in serum: Secondary | ICD-10-CM | POA: Diagnosis not present

## 2019-01-30 DIAGNOSIS — R7989 Other specified abnormal findings of blood chemistry: Secondary | ICD-10-CM

## 2019-01-30 DIAGNOSIS — B159 Hepatitis A without hepatic coma: Secondary | ICD-10-CM

## 2019-01-30 DIAGNOSIS — D649 Anemia, unspecified: Secondary | ICD-10-CM

## 2019-01-30 DIAGNOSIS — L03114 Cellulitis of left upper limb: Secondary | ICD-10-CM

## 2019-01-30 DIAGNOSIS — Z09 Encounter for follow-up examination after completed treatment for conditions other than malignant neoplasm: Secondary | ICD-10-CM

## 2019-01-30 LAB — HEPATITIS PANEL, ACUTE
Hep B C Total Ab Interp: NONREACTIVE
Hep B S Ab: NONREACTIVE
Hepatitis A AB,Total: REACTIVE
Hepatitis B Surface Antigen: REACTIVE
Hepatitis C Ab: NONREACTIVE

## 2019-01-30 LAB — CK: CK Total: 1183

## 2019-01-30 LAB — CHG COVID-19 LAB TEST NON-CDC: PCR Ampl + Detection: NEGATIVE

## 2019-01-30 MED ORDER — DOXYCYCLINE HYCLATE 100 MG PO TABS: 100.0000 mg | ORAL_TABLET | Freq: Two times a day (BID) | ORAL | 0 refills | Status: DC

## 2019-01-30 NOTE — Patient Instructions (Signed)
Thank you for coming in today. Get labs now.  Start doxycycline for infection.  Keep me updated via mychart.  I will talk with Dr Madilyn Fireman to see if she want to move your visit sooner.

## 2019-01-30 NOTE — Progress Notes (Signed)
Patient was originally scheduled to see me today for WebEx virtual visit for elbow bursitis.  Upon chart review he was recently hospitalized for what looks like alcohol withdrawal seizure and inspection of his elbow under WebEx video conferencing shows what is concerning for bursitis.  This will require in person office visit.  Patient is scheduled later today.  No charge for today's initial evaluation.Marcus Torres

## 2019-01-30 NOTE — Progress Notes (Signed)
Marcus Torres is a 43 y.o. male who presents to [facility] today for left elbow olecranon bursitis.  Marcus Torres was recently discharged from the hospital on March 28 for what appears to be alcohol withdrawal seizures.  He notes that when he was sent home he developed left elbow pain and swelling worsening on Monday, March 30.  He notes it is more painful red and swollen over the last few days.  He thinks it slightly improving but denies any fevers chills nausea vomiting or diarrhea currently.  Additionally during his hospitalization he had bibasilar atelectasis on x-ray.  His symptoms are somewhat initially concerning for COVID-19 however he did have a negative COVID-19 test while as an inpatient.  Additionally during his hospitalization he had nontraumatic rhabdomyolysis with peak CK at 1591 trending down.  His blood pressure was elevated and was sent home on metoprolol and Tribenzor his home medications and was started on amlodipine.  He never restarted Tribenzor and continues to take metoprolol and amlodipine.  He feels well and notes that his blood pressures typically well controlled.  His troponins were also elevated initially with a peak of 0.316 and trending down.  QTc was slightly elevated as well at 483.  His seizure was thought to be due to alcohol withdrawal.  He drinks 750 liters of vodka daily.  He was sent home with naltrexone.  CIWA course 0 in hospital.  Patient had an acute liver panel which showed positive hepatitis a IgG and IgM as well as positive hepatitis B antigen with negative hepatitis B antibodies and negative hepatitis C antibodies.  Additionally his ammonia level was elevated during his hospitalization and he was sent home with lactulose.  He tolerates twice daily lactulose currently.  He denies any known confusion.  ROS as above: No headache, visual  changes, nausea, vomiting, diarrhea, constipation, dizziness, abdominal pain, skin rash, fevers, chills, night sweats, weight loss, swollen lymph nodes, body aches, joint swelling, muscle aches, chest pain, shortness of breath, mood changes, visual or auditory hallucinations.   Exam:  BP 132/71   Pulse 63   Temp 98.1 F (36.7 C) (Oral)   Wt (!) 337 lb (152.9 kg)   BMI 43.27 kg/m  Wt Readings from Last 5 Encounters:  01/30/19 (!) 337 lb (152.9 kg)  11/29/18 (!) 331 lb (150.1 kg)  11/15/18 (!) 325 lb (147.4 kg)  11/14/18 (!) 322 lb 8 oz (146.3 kg)  09/18/18 (!) 309 lb 12.8 oz (140.5 kg)    Gen: Well NAD HEENT: EOMI,  MMM Lungs: Normal work of breathing. CTABL Heart: RRR no MRG Abd: NABS, Soft. Nondistended, Nontender Exts: Brisk capillary refill, warm and well perfused.  Left elbow: Erythematous patch of skin with no significant fluctuance at the olecranon.  Elbow motion is intact with normal range of motion.  Some pain with resisted extension and flexion.  Pulses cap refill and sensation are intact distally. Psych alert and oriented normal speech thought process and affect.      Lab and Radiology Results No results found for this or any previous visit (from the past 72 hour(s)). Dg Elbow Complete Left  Result Date: 01/30/2019 CLINICAL DATA:  Fall, left elbow pain EXAM: LEFT ELBOW - COMPLETE 3+ VIEW COMPARISON:  None. FINDINGS: Soft tissue swelling noted posteriorly. No fracture, subluxation or dislocation. No joint effusion. IMPRESSION: No acute bony abnormality. Electronically Signed   By: Rolm Baptise M.D.   On: 01/30/2019 11:58  I personally (independently) visualized and performed the interpretation of the images attached in this note.  Recent Results (from the past 2160 hour(s))  HM HEPATITIS C SCREENING LAB     Status: None   Collection Time: 01/23/19 12:00 AM  Result Value Ref Range   HM Hepatitis Screen Negative-Validated   Hepatitis panel, acute     Status:  Abnormal   Collection Time: 01/24/19 12:00 AM  Result Value Ref Range   Hep B S Ab NONREACTIVE    Hep B C Total Ab Interp NONREACTIVE    Hepatitis C Ab NONREACTIVE    Hepatitis A AB,Total Reactive    Hepatitis B Surface Antigen Reactive   COVID-19 TESTING, NON-CDC TESTING - U0002     Status: Normal   Collection Time: 01/24/19 12:00 AM  Result Value Ref Range   PCR Ampl + Detection Negative   CHG CLOSTRIDIUM AG, EIA     Status: Abnormal   Collection Time: 01/24/19 11:47 AM  Result Value Ref Range   C Diff antigen Positive   Hepatic function panel     Status: Abnormal   Collection Time: 01/25/19 12:00 AM  Result Value Ref Range   Alkaline Phosphatase 199 (A) 25 - 125   ALT 41 (A) 10 - 40   AST 176 (A) 14 - 40   Bilirubin, Total 4.0   CBC and differential     Status: Abnormal   Collection Time: 01/26/19 12:00 AM  Result Value Ref Range   Hemoglobin 10.9 (A) 13.5 - 97.9  Basic metabolic panel     Status: Abnormal   Collection Time: 01/26/19 12:00 AM  Result Value Ref Range   Glucose 78    BUN 12 4 - 21   Creatinine 0.7 0.6 - 1.3   Potassium 3.7 3.4 - 5.3   Sodium 134 (A) 137 - 147  CK     Status: Abnormal   Collection Time: 01/26/19 12:00 AM  Result Value Ref Range   CK Total 1,183      Assessment and Plan: 43 y.o. male with  Left elbow cellulitis versus early olecranon bursitis.  No large bursa sac to be drained today.  Plan for treatment with doxycycline and recheck via WebEx with PCP in the near future.  Recheck with me in the near future as well if needed.  Hypertension: Blood pressure controlled.  Continue with Toprol and amlodipine.  At this point do not think is necessary to restart Tribenzor.  Seizures: No subsequent repeat seizures.  Continue to work on alcohol abstinence.  Elevated CK: Clinically doing well.  Recheck CK and kidney function  Hepatitis A and B abnormal labs.  Plan to recheck hepatitis a and check hepatitis B PCR.  Elevated ammonia.  Recheck  ammonia level continue current lactulose.  Follow-up with PCP in near future.  PDMP reviewed during this encounter. Orders Placed This Encounter  Procedures  . DG Elbow Complete Left    Standing Status:   Future    Number of Occurrences:   1    Standing Expiration Date:   03/31/2020    Order Specific Question:   Reason for Exam (SYMPTOM  OR DIAGNOSIS REQUIRED)    Answer:   [place] left elbow pain    Order Specific Question:   Preferred imaging location?    Answer:   [facility]    Order Specific Question:   Radiology Contrast Protocol - do NOT remove file path    Answer:   [email or web address]  .  [facility] HEPATITIS C SCREENING LAB    This external order was created through the Results Console.  . CBC and differential    This external order was created through the Results Console.  . Basic metabolic panel    This external order was created through the Results Console.  . Hepatic function panel    This external order was created through the Results Console.  . CK    This order was created through External Result Entry  . Hepatitis panel, acute    This order was created through External Result Entry  . CK  . Ammonia  . COMPLETE METABOLIC PANEL WITH GFR  . Hepatitis B DNA, ultraquantitative, PCR  . Hepatitis A Ab, Total  . CBC  . CHG CLOSTRIDIUM AG, EIA    This order was created through External Result Entry  . COVID-19 TESTING, NON-CDC TESTING - U0002    This order was created through External Result Entry   Meds ordered this encounter  Medications  . doxycycline (VIBRA-TABS) 100 MG tablet    Sig: Take 1 tablet (100 mg total) by mouth 2 (two) times daily.    Dispense:  14 tablet    Refill:  0     Historical information moved to improve visibility of documentation.  Past Medical History:  Diagnosis Date  . Alcohol abuse   . Elevated liver enzymes   . Hypertension   . Normal cardiac stress test 05/09/2011   Negative treadmill  performed at [facility] cardiology   Past Surgical History:  Procedure Laterality Date  . ARTHROSCOPIC REPAIR ACL  2016   Dr. Annie Main Pill    Social History   Tobacco Use  . Smoking status: Former Smoker    Packs/day: 0.25    Types: Cigarettes  . Smokeless tobacco: Never Used  Substance Use Topics  . Alcohol use: Yes    Alcohol/week: 28.0 standard drinks    Types: 28 Standard drinks or equivalent per week    Comment: per week   family history includes Hyperlipidemia in his mother; Hypertension in his mother; Other in an other family member.  Medications: Current Outpatient Medications  Medication Sig Dispense Refill  . amLODipine (NORVASC) 10 MG tablet Take 10 mg by mouth daily.    . Multiple Vitamins-Minerals (MULTIVITAMIN ADULT PO) Take by mouth.    . thiamine 100 MG tablet Take by mouth.    . ALPRAZolam (XANAX) 0.5 MG tablet Take 0.5 mg by mouth 2 (two) times daily as needed.    . AMBULATORY NON FORMULARY MEDICATION Medication Name:CPAP machine and supplies with humidifier. Set to 15 cm of water pressure. Dx: G 47.33 severe OSA. Fax to [facility] 1 Units 0  . doxycycline (VIBRA-TABS) 100 MG tablet Take 1 tablet (100 mg total) by mouth 2 (two) times daily. 14 tablet 0  . FLUoxetine (PROZAC) 40 MG capsule Take 40 mg by mouth daily.    Marland Kitchen lactulose (CHRONULAC) 10 GM/15ML solution Take 15 mLs by mouth 2 (two) times daily.    . metoprolol tartrate (LOPRESSOR) 50 MG tablet TAKE ONE TABLET BY MOUTH TWO TIMES A DAY 60 tablet 3  . naltrexone (DEPADE) 50 MG tablet Take 50 mg by mouth daily.    . nitroGLYCERIN (NITROSTAT) 0.4 MG SL tablet Place 1 tablet (0.4 mg total) under the tongue every 5 (five) minutes as needed for chest pain. 12 tablet 3  . omeprazole (PRILOSEC) 20 MG capsule Take 20 mg by mouth daily as needed.    Marland Kitchen  temazepam (RESTORIL) 30 MG capsule Take 30 mg by mouth at bedtime as needed.     No current facility-administered medications for this visit.     Allergies  Allergen Reactions  . Lisinopril Cough  . Lisinopril-Hydrochlorothiazide Cough  . Losartan Cough     Discussed warning signs or symptoms. Please see discharge instructions. Patient expresses understanding.

## 2019-02-09 LAB — CBC
HCT: 32.3 % — ABNORMAL LOW (ref 38.5–50.0)
Hemoglobin: 11.1 g/dL — ABNORMAL LOW (ref 13.2–17.1)
MCH: 30 pg (ref 27.0–33.0)
MCHC: 34.4 g/dL (ref 32.0–36.0)
MCV: 87.3 fL (ref 80.0–100.0)
MPV: 10.8 fL (ref 7.5–12.5)
Platelets: 255 10*3/uL (ref 140–400)
RBC: 3.7 10*6/uL — ABNORMAL LOW (ref 4.20–5.80)
RDW: 15.8 % — ABNORMAL HIGH (ref 11.0–15.0)
WBC: 5.7 10*3/uL (ref 3.8–10.8)

## 2019-02-09 LAB — COMPLETE METABOLIC PANEL WITH GFR
AG Ratio: 1.1 (calc) (ref 1.0–2.5)
ALT: 64 U/L — ABNORMAL HIGH (ref 9–46)
AST: 127 U/L — ABNORMAL HIGH (ref 10–40)
Albumin: 3.9 g/dL (ref 3.6–5.1)
Alkaline phosphatase (APISO): 332 U/L — ABNORMAL HIGH (ref 36–130)
BUN: 13 mg/dL (ref 7–25)
CO2: 24 mmol/L (ref 20–32)
Calcium: 9.5 mg/dL (ref 8.6–10.3)
Chloride: 101 mmol/L (ref 98–110)
Creat: 0.71 mg/dL (ref 0.60–1.35)
GFR, Est African American: 132 mL/min/{1.73_m2} (ref >=60)
GFR, Est Non African American: 114 mL/min/{1.73_m2} (ref >=60)
Globulin: 3.6 g/dL (calc) (ref 1.9–3.7)
Glucose, Bld: 82 mg/dL (ref 65–99)
Potassium: 4.3 mmol/L (ref 3.5–5.3)
Sodium: 135 mmol/L (ref 135–146)
Total Bilirubin: 2.2 mg/dL — ABNORMAL HIGH (ref 0.2–1.2)
Total Protein: 7.5 g/dL (ref 6.1–8.1)

## 2019-02-09 LAB — HEPATITIS B DNA, ULTRAQUANTITATIVE, PCR
Hepatitis B DNA (Calc): <1 Log IU/mL
Hepatitis B DNA: <10 IU/mL

## 2019-02-09 LAB — HEPATITIS A ANTIBODY, TOTAL: Hepatitis A AB,Total: NONREACTIVE

## 2019-02-09 LAB — CK: Total CK: 151 U/L (ref 44–196)

## 2019-02-09 LAB — AMMONIA: Ammonia: 142 umol/L — ABNORMAL HIGH (ref <=72)

## 2019-02-11 ENCOUNTER — Telehealth (INDEPENDENT_AMBULATORY_CARE_PROVIDER_SITE_OTHER): Payer: 59 | Admitting: Family Medicine

## 2019-02-11 ENCOUNTER — Encounter: Payer: Self-pay | Admitting: Family Medicine

## 2019-02-11 VITALS — Ht 74.0 in | Wt 337.0 lb

## 2019-02-11 DIAGNOSIS — I10 Essential (primary) hypertension: Secondary | ICD-10-CM

## 2019-02-11 DIAGNOSIS — R7989 Other specified abnormal findings of blood chemistry: Secondary | ICD-10-CM

## 2019-02-11 DIAGNOSIS — F1010 Alcohol abuse, uncomplicated: Secondary | ICD-10-CM

## 2019-02-11 MED ORDER — LACTULOSE 10 GM/15ML PO SOLN: 20.0000 g | Freq: Two times a day (BID) | ORAL | 1 refills | Status: DC

## 2019-02-11 MED ORDER — OLMESARTAN-AMLODIPINE-HCTZ 40-10-25 MG PO TABS: 1.0000 | ORAL_TABLET | Freq: Every day | ORAL | 5 refills | Status: DC

## 2019-02-11 NOTE — Progress Notes (Signed)
Virtual Visit via Video Note  I connected with Marcus Torres on 02/11/19 at  1:40 PM EDT by a video enabled telemedicine application and verified that I am speaking with the correct person using two identifiers.   I discussed the limitations of evaluation and management by telemedicine and the availability of in person appointments. The patient expressed understanding and agreed to proceed.  Subjective:    CC: Hospital follow-up.  HPI: 43 year old male being evaluated for hospital follow-up.  He was admitted to [facility] on March 25 and discharged home 3 days later on March 28.  He actually presented with loss of consciousness and seizure activity.  They felt like it was secondary to his alcohol abuse.  He was evaluated with neurology.  He was counseled extensively on alcohol cessation.  CK was elevated and they had recommended following that making sure it trended back down.  He was also started on lactulose for elevated ammonia levels.  HTN - he has started on amlodipine and then it was increased to 10 mg along with metoprolol.  He says that it is okay but he started to notice some significant swelling around his ankles so he would really rather go back on the Tribenzor.  He feels like he felt better on it.  elevated ammonia levels -his ammonia levels were still elevated on recheck about a week ago.  We increased his lactulose to 30 mL twice a day.  He says he is almost out of the bottle that he currently has he has been getting some loose stools with it.  F/U alcohol abuse -he is currently not drinking.  He is not currently actively participating in an alcohol quit program such as alcoholics anonymous.  No more LOC or seizure like activity.   He was also noted to have some abnormalities with hepatitis screening so Dr. Georgina Snell who saw him for olecranon bursitis reordered some antibody levels and they were negative.  Past medical history, Surgical history, Family history not pertinant except as  noted below, Social history, Allergies, and medications have been entered into the medical record, reviewed, and corrections made.   Review of Systems: No fevers, chills, night sweats, weight loss, chest pain, or shortness of breath.   Objective:    General: Speaking clearly in complete sentences without any shortness of breath.  Alert and oriented x3.  Normal judgment. No apparent acute distress.    Impression and Recommendations:   Hypertension-we will switch back to Tribenzor but will actually increase his amlodipine component to 10 mg but will need to monitor for ankle swelling.  We will continue to keep an eye on the pressure and could consider adding the metoprolol back if needed.  He is going try to get a blood pressure cuff machine for his home but encouraged him to make sure that he purchases an arm cuff and something that has a larger fit.  Alcohol abuse-did encourage him to try to get involved with something like alcoholics anonymous.  I am sure there offing some type of virtual visit is right now and notes not optimal but I still think it could be helpful for him.  Tend to continue to work on alcohol cessation.  Elevated ammonia levels -increase his lactulose to 30 mL twice a day last week.  He is tolerating it well so far.  I did let him know that if he needed to cut the dose in half occasionally because of loose stools that that would be fine to do  so.  I really like to recheck his ammonia level in about 1 to 2 weeks.  Loss of consciousness/seizure-seems to have resolved and he is not had any recurrence likely related to his alcohol abuse.  I discussed the assessment and treatment plan with the patient. The patient was provided an opportunity to ask questions and all were answered. The patient agreed with the plan and demonstrated an understanding of the instructions.   The patient was advised to call back or seek an in-person evaluation if the symptoms worsen or if the condition  fails to improve as anticipated.   Beatrice Lecher, MD

## 2019-02-11 NOTE — Progress Notes (Signed)
Pt wanted to know about restarting tribenzor. He said that he is having swelling in his ankles and feet.Maryruth Eve, Lahoma Crocker, CMA

## 2019-02-12 ENCOUNTER — Telehealth: Payer: Self-pay

## 2019-02-12 NOTE — Telephone Encounter (Signed)
Patient will stick with the other prescription.

## 2019-02-12 NOTE — Telephone Encounter (Signed)
Arun called and states the BP medication is $140 for a 90 day supply. He wanted to know if there was a cheaper alternative. Please advise.

## 2019-02-12 NOTE — Telephone Encounter (Signed)
We can go back to the amlodipine, metoprolol and hCTZ seperately. It would be cheaper that way. The HCT will help control the swelling from the amlodipine.  See what he would like to do

## 2019-02-18 ENCOUNTER — Other Ambulatory Visit: Payer: Self-pay | Admitting: *Deleted

## 2019-02-18 ENCOUNTER — Other Ambulatory Visit: Payer: Self-pay | Admitting: Family Medicine

## 2019-10-21 DIAGNOSIS — J9621 Acute and chronic respiratory failure with hypoxia: Secondary | ICD-10-CM | POA: Insufficient documentation

## 2019-10-21 DIAGNOSIS — J9622 Acute and chronic respiratory failure with hypercapnia: Secondary | ICD-10-CM | POA: Insufficient documentation

## 2019-11-28 ENCOUNTER — Telehealth: Payer: Self-pay

## 2019-11-28 NOTE — Telephone Encounter (Signed)
Gave ok for orders to [facility] with [facility]. I called to schedule the patient, no answer, voicemail is full.

## 2019-11-28 NOTE — Telephone Encounter (Signed)
Okay for verbal orders and lets make sure that he has scheduled a hospital follow-up.  If he is fairly weak and deconditioned we can always do a virtual if needed.  Please schedule the 40-minute if possible.

## 2019-11-28 NOTE — Telephone Encounter (Signed)
Tanya with [facility] called and requested verbal orders for PT and OT. She states he was discharged from the hospital for acute respiratory infection and GI bleed.

## 2019-11-29 MED ORDER — BUDESONIDE 0.5 MG/2ML IN SUSP
0.50 | RESPIRATORY_TRACT | Status: DC
Start: 2019-11-28 — End: 2019-11-29

## 2019-11-29 MED ORDER — MELATONIN 3 MG PO TABS
6.00 | ORAL_TABLET | ORAL | Status: DC
Start: 2019-11-28 — End: 2019-11-29

## 2019-11-29 MED ORDER — ARFORMOTEROL TARTRATE 15 MCG/2ML IN NEBU
15.00 | INHALATION_SOLUTION | RESPIRATORY_TRACT | Status: DC
Start: 2019-11-28 — End: 2019-11-29

## 2019-11-29 MED ORDER — ONDANSETRON HCL 4 MG/2ML IJ SOLN
4.00 | INTRAMUSCULAR | Status: DC
Start: ? — End: 2019-11-29

## 2019-11-29 MED ORDER — QUINTABS PO TABS
1.00 | ORAL_TABLET | ORAL | Status: DC
Start: 2019-11-29 — End: 2019-11-29

## 2019-11-29 MED ORDER — LIDOCAINE 4 % EX PTCH
1.00 | MEDICATED_PATCH | CUTANEOUS | Status: DC
Start: 2019-11-29 — End: 2019-11-29

## 2019-11-29 MED ORDER — PANTOPRAZOLE SODIUM 40 MG PO TBEC
40.00 | DELAYED_RELEASE_TABLET | ORAL | Status: DC
Start: 2019-11-28 — End: 2019-11-29

## 2019-11-29 MED ORDER — MAGNESIUM OXIDE 400 MG PO TABS
400.00 | ORAL_TABLET | ORAL | Status: DC
Start: 2019-11-28 — End: 2019-11-29

## 2019-11-29 MED ORDER — LACTULOSE 10 GM/15ML PO SOLN
10.00 | ORAL | Status: DC
Start: 2019-11-29 — End: 2019-11-29

## 2019-11-29 MED ORDER — PROPRANOLOL HCL 10 MG PO TABS
20.00 | ORAL_TABLET | ORAL | Status: DC
Start: 2019-11-28 — End: 2019-11-29

## 2019-11-29 MED ORDER — BENZONATATE 100 MG PO CAPS
100.00 | ORAL_CAPSULE | ORAL | Status: DC
Start: ? — End: 2019-11-29

## 2019-11-29 MED ORDER — FOLIC ACID 1 MG PO TABS
1.00 | ORAL_TABLET | ORAL | Status: DC
Start: 2019-11-29 — End: 2019-11-29

## 2019-11-29 MED ORDER — CLONAZEPAM 0.5 MG PO TABS
0.50 | ORAL_TABLET | ORAL | Status: DC
Start: 2019-11-28 — End: 2019-11-29

## 2019-11-29 MED ORDER — FLUOXETINE HCL 20 MG PO CAPS
40.00 | ORAL_CAPSULE | ORAL | Status: DC
Start: 2019-11-29 — End: 2019-11-29

## 2019-11-29 MED ORDER — HYDROXYZINE HCL 25 MG PO TABS
25.00 | ORAL_TABLET | ORAL | Status: DC
Start: ? — End: 2019-11-29

## 2019-11-29 MED ORDER — HYDROCHLOROTHIAZIDE 25 MG PO TABS
25.00 | ORAL_TABLET | ORAL | Status: DC
Start: 2019-11-29 — End: 2019-11-29

## 2019-11-29 MED ORDER — ALBUTEROL SULFATE (2.5 MG/3ML) 0.083% IN NEBU
2.50 | INHALATION_SOLUTION | RESPIRATORY_TRACT | Status: DC
Start: ? — End: 2019-11-29

## 2019-11-29 MED ORDER — ACETAMINOPHEN 500 MG PO TABS
500.00 | ORAL_TABLET | ORAL | Status: DC
Start: ? — End: 2019-11-29

## 2019-11-29 MED ORDER — SPIRONOLACTONE 25 MG PO TABS
50.00 | ORAL_TABLET | ORAL | Status: DC
Start: 2019-11-28 — End: 2019-11-29

## 2019-11-29 MED ORDER — FUROSEMIDE 40 MG PO TABS
80.00 | ORAL_TABLET | ORAL | Status: DC
Start: 2019-11-29 — End: 2019-11-29

## 2019-11-29 MED ORDER — THIAMINE HCL 100 MG PO TABS
100.00 | ORAL_TABLET | ORAL | Status: DC
Start: 2019-11-29 — End: 2019-11-29

## 2019-12-03 NOTE — Telephone Encounter (Signed)
Patient has been scheduled

## 2019-12-04 ENCOUNTER — Encounter: Payer: Self-pay | Admitting: Family Medicine

## 2019-12-05 NOTE — Telephone Encounter (Signed)
Home Health evaluated him for PT and OT. They state he is doing well and will not need PT and OT. However he will need speech therapy. Advised ok for Speech Therapy.

## 2019-12-11 ENCOUNTER — Ambulatory Visit (INDEPENDENT_AMBULATORY_CARE_PROVIDER_SITE_OTHER): Payer: 59 | Admitting: Family Medicine

## 2019-12-11 ENCOUNTER — Telehealth: Payer: Self-pay

## 2019-12-11 ENCOUNTER — Encounter: Payer: Self-pay | Admitting: Family Medicine

## 2019-12-11 ENCOUNTER — Other Ambulatory Visit: Payer: Self-pay

## 2019-12-11 VITALS — BP 116/54 | HR 61 | Ht 74.0 in | Wt 277.0 lb

## 2019-12-11 DIAGNOSIS — J9621 Acute and chronic respiratory failure with hypoxia: Secondary | ICD-10-CM | POA: Diagnosis not present

## 2019-12-11 DIAGNOSIS — I10 Essential (primary) hypertension: Secondary | ICD-10-CM

## 2019-12-11 DIAGNOSIS — K921 Melena: Secondary | ICD-10-CM

## 2019-12-11 DIAGNOSIS — E041 Nontoxic single thyroid nodule: Secondary | ICD-10-CM | POA: Diagnosis not present

## 2019-12-11 DIAGNOSIS — N183 Chronic kidney disease, stage 3 unspecified: Secondary | ICD-10-CM

## 2019-12-11 DIAGNOSIS — K7010 Alcoholic hepatitis without ascites: Secondary | ICD-10-CM

## 2019-12-11 DIAGNOSIS — G4734 Idiopathic sleep related nonobstructive alveolar hypoventilation: Secondary | ICD-10-CM

## 2019-12-11 DIAGNOSIS — R5381 Other malaise: Secondary | ICD-10-CM

## 2019-12-11 DIAGNOSIS — J9622 Acute and chronic respiratory failure with hypercapnia: Secondary | ICD-10-CM

## 2019-12-11 MED ORDER — AMBULATORY NON FORMULARY MEDICATION: 0 refills | Status: DC

## 2019-12-11 NOTE — Telephone Encounter (Signed)
Patient advised. Also advised home health.

## 2019-12-11 NOTE — Assessment & Plan Note (Signed)
No active  bleeding found on exam.  He did follow-up with GI last week.  Did receive a total of 11 units of packed red blood cells during his hospitalization.  Continue Protonix twice daily.

## 2019-12-11 NOTE — Telephone Encounter (Signed)
Yes, okay for speech therapy.  Also please call patient and let him know that they did see a nodule and enlargement in his right thyroid lobe.  So we do need to follow that up with an ultrasound.  I did go ahead and place the order and he can schedule it at his convenience in the next couple of weeks.

## 2019-12-11 NOTE — Assessment & Plan Note (Signed)
Did have acute kidney injury during hospitalization which seems to have resolved and last creatinine was at baseline.  We will need to keep an eye on this and follow every 6 months.

## 2019-12-11 NOTE — Telephone Encounter (Signed)
Marcus Torres with [facility] called and left a message. She wants a verbal to start speech therapy. Please advise.

## 2019-12-11 NOTE — Assessment & Plan Note (Signed)
Oxygenating well today but still on about 1-1/2 L at night.  We will plan to get a overnight oxygen study through home health in about 2 weeks off of his oxygen to see if he is able to come off of it.  It sounds like each day he is getting a lot stronger and feeling better and getting more active.

## 2019-12-11 NOTE — Assessment & Plan Note (Signed)
He is currently abstinent with his alcohol.  We did discuss the risks of continuing with the benzodiazepine and encouraged him to work on starting to wean the medication.

## 2019-12-11 NOTE — Patient Instructions (Signed)
Start weaning your clonazepam.

## 2019-12-11 NOTE — Progress Notes (Signed)
Established Patient Office Visit  Subjective:  Patient ID: Marcus Torres, male    DOB: Feb 13, 1976  Age: 44 y.o. MRN: [id number]  CC:  Chief Complaint  Patient presents with  . Hospitalization Follow-up    HPI COLIE CASTOR presents for Hospital follow up.    Hospital notes reviewed from [facility].  He was admitted on December 8 and discharged home on January 28 for acute hypoxic respiratory failure and acute blood loss secondary to anemia and cirrhosis of the liver because of a GI bleed.  Per records he had acute blood loss anemia secondary to a GI bleed and actually received 6 units of packed red blood cells.  He underwent EGD and colonoscopy on December 12 which revealed a colonic polyp, portal hypertensive gastropathy and a small area of esophageal varices.  Patient was transferred to ICU after his procedure and was unable to be extubated secondary to hypercarbia and low tidal volumes.  He also ran a fever of 102.4.  He also underwent paracentesis which was negative for SBP and he was started on vancomycin and cefepime.  Tracheal aspirate was consistent with MSSA and antibiotics were transitioned to Cipro and Ancef on December 15.  He then developed an ileus and acute kidney injury.  Nephrology was consulted and he started to undergo aggressive diuresis.  Patient self extubated on December 20 and then was placed on BiPAP.  Unfortunately had to be reintubated the following day.  He was then found to have an acute left gastroc DVT found on December 23 and Lovenox was initiated.  He then had another tracheal aspirate which showed staph aureus on December 31 and patient was given Ancef.  He had a repeat EGD and flex sigmoidoscopy performed on January 7 after melena was discovered.  He was found to have a small esophageal varices again but without any acute GI bleed.  He was finally extubated on January 9 and determined stable to be transferred to the floor on January 14.  He did receive dialysis  briefly while in the ICU but his creatinine did return to baseline before discharge.  Past Medical History:  Diagnosis Date  . Alcohol abuse   . Elevated liver enzymes   . Hypertension   . Normal cardiac stress test 05/09/2011   Negative treadmill performed at [facility] cardiology    Past Surgical History:  Procedure Laterality Date  . ARTHROSCOPIC REPAIR ACL  2016   Dr. Annie Main Pill     Family History  Problem Relation Age of Onset  . Other Other        Cardiac stent  . Hypertension Mother   . Hyperlipidemia Mother     Social History   Socioeconomic History  . Marital status: Married    Spouse name: Not on file  . Number of children: Not on file  . Years of education: Not on file  . Highest education level: Not on file  Occupational History  . Not on file  Tobacco Use  . Smoking status: Former Smoker    Packs/day: 0.25    Types: Cigarettes  . Smokeless tobacco: Never Used  Substance and Sexual Activity  . Alcohol use: Yes    Alcohol/week: 28.0 standard drinks    Types: 28 Standard drinks or equivalent per week    Comment: per week  . Drug use: No  . 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 Not on file  Social  History Narrative  . Not on file   Social Determinants of Health   Financial Resource Strain:   . Difficulty of Paying Living Expenses: Not on file  Food Insecurity:   . Worried About Charity fundraiser in the Last Year: Not on file  . Ran Out of Food in the Last Year: Not on file  Transportation Needs:   . Lack of Transportation (Medical): Not on file  . Lack of Transportation (Non-Medical): Not on file  Physical Activity:   . Days of Exercise per Week: Not on file  . Minutes of Exercise per Session: Not on file  Stress:   . Feeling of Stress : Not on file  Social Connections:   . Frequency of Communication with Friends and Family: Not on file  . Frequency of Social Gatherings with Friends and Family:  Not on file  . Attends Religious Services: Not on file  . Active Member of Clubs or Organizations: Not on file  . Attends Archivist Meetings: Not on file  . Marital Status: Not on file  Intimate Partner Violence:   . Fear of Current or Ex-Partner: Not on file  . Emotionally Abused: Not on file  . Physically Abused: Not on file  . Sexually Abused: Not on file    Outpatient Medications Prior to Visit  Medication Sig Dispense Refill  . albuterol (VENTOLIN HFA) 108 (90 Base) MCG/ACT inhaler Inhale 2 puffs into the lungs every 6 (six) hours as needed.    . folic acid (FOLVITE) 1 MG tablet Take 1 mg by mouth daily.    . furosemide (LASIX) 80 MG tablet Take 40 mg by mouth daily.    . hydrochlorothiazide (HYDRODIURIL) 25 MG tablet Take 25 mg by mouth daily.    . magnesium oxide (MAG-OX) 400 MG tablet Take 1 tablet by mouth daily.    . pantoprazole (PROTONIX) 40 MG tablet Take 1 tablet by mouth 2 (two) times daily.    . propranolol (INDERAL) 20 MG tablet Take 20 mg by mouth 2 (two) times daily.    Marland Kitchen PULMICORT FLEXHALER 180 MCG/ACT inhaler 1 puff 2 (two) times daily.    Marland Kitchen spironolactone (ALDACTONE) 50 MG tablet Take 50 mg by mouth 2 (two) times daily.    . AMBULATORY NON FORMULARY MEDICATION Medication Name:CPAP machine and supplies with humidifier. Set to 15 cm of water pressure. Dx: G 47.33 severe OSA. Fax to [facility] 1 Units 0  . amLODipine (NORVASC) 10 MG tablet Take 10 mg by mouth daily.    . clonazePAM (KLONOPIN) 0.5 MG tablet Take 0.5 mg by mouth 2 (two) times daily as needed.    Marland Kitchen FLUoxetine (PROZAC) 40 MG capsule Take 40 mg by mouth daily.    Marland Kitchen lactulose (CHRONULAC) 10 GM/15ML solution TAKE 30 MLS (20 G TOTAL) BY MOUTH 2 (TWO) TIMES DAILY. 946 mL 1  . metoprolol tartrate (LOPRESSOR) 50 MG tablet TAKE ONE TABLET BY MOUTH TWO TIMES A DAY 60 tablet 3  . Multiple Vitamins-Minerals (MULTIVITAMIN ADULT PO) Take by mouth.    . naltrexone (DEPADE) 50 MG tablet Take 50 mg by  mouth daily.    . nitroGLYCERIN (NITROSTAT) 0.4 MG SL tablet Place 1 tablet (0.4 mg total) under the tongue every 5 (five) minutes as needed for chest pain. 12 tablet 3  . omeprazole (PRILOSEC) 20 MG capsule Take 20 mg by mouth daily as needed.    Marland Kitchen REXULTI 2 MG TABS Take 1 tablet by mouth daily.    Marland Kitchen  thiamine 100 MG tablet Take by mouth.    . ALPRAZolam (XANAX) 0.5 MG tablet Take 0.5 mg by mouth 2 (two) times daily as needed.     No facility-administered medications prior to visit.    Allergies  Allergen Reactions  . Lisinopril Cough  . Lisinopril-Hydrochlorothiazide Cough  . Losartan Cough    ROS Review of Systems    Objective:    Physical Exam  Constitutional: He is oriented to person, place, and time. He appears well-developed and well-nourished.  HENT:  Head: Normocephalic and atraumatic.  Cardiovascular: Normal rate, regular rhythm and normal heart sounds.  Pulmonary/Chest: Effort normal and breath sounds normal.  Neurological: He is alert and oriented to person, place, and time.  Skin: Skin is warm and dry.  Psychiatric: He has a normal mood and affect. His behavior is normal.    BP (!) 116/54   Pulse 61   Ht 6\' 2"  (1.88 m)   Wt 277 lb (125.6 kg)   SpO2 96%   BMI 35.56 kg/m  Wt Readings from Last 3 Encounters:  12/11/19 277 lb (125.6 kg)  02/11/19 (!) 337 lb (152.9 kg)  01/30/19 (!) 337 lb (152.9 kg)     There are no preventive care reminders to display for this patient.  There are no preventive care reminders to display for this patient.  Lab Results  Component Value Date   TSH 0.854 03/23/2012   Lab Results  Component Value Date   WBC 5.7 02/05/2019   HGB 11.1 (L) 02/05/2019   HCT 32.3 (L) 02/05/2019   MCV 87.3 02/05/2019   PLT 255 02/05/2019   Lab Results  Component Value Date   NA 135 02/05/2019   K 4.3 02/05/2019   CO2 24 02/05/2019   GLUCOSE 82 02/05/2019   BUN 13 02/05/2019   CREATININE 0.71 02/05/2019   BILITOT 2.2 (H) 02/05/2019    ALKPHOS 199 (A) 01/25/2019   AST 127 (H) 02/05/2019   ALT 64 (H) 02/05/2019   PROT 7.5 02/05/2019   ALBUMIN 3.9 09/18/2018   CALCIUM 9.5 02/05/2019   ANIONGAP 9 09/18/2018   Lab Results  Component Value Date   CHOL 318 (H) 11/03/2015   Lab Results  Component Value Date   HDL 49 11/03/2015   Lab Results  Component Value Date   LDLCALC NOT CALC 11/03/2015   Lab Results  Component Value Date   TRIG 597 (H) 11/03/2015   Lab Results  Component Value Date   CHOLHDL 6.5 (H) 11/03/2015   No results found for: HGBA1C    Assessment & Plan:   Problem List Items Addressed This Visit      Cardiovascular and Mediastinum   ESSENTIAL HYPERTENSION, BENIGN    Blood pressure at goal today.  On furosemide and spironolactone daily.  Due to recheck BMP.  He says he just had labs done with GI on Monday so we will try to get a copy of those to see if that is adequate or if we still need to have him go to the lab.      Relevant Medications   spironolactone (ALDACTONE) 50 MG tablet   propranolol (INDERAL) 20 MG tablet   hydrochlorothiazide (HYDRODIURIL) 25 MG tablet   furosemide (LASIX) 80 MG tablet     Respiratory   Acute on chronic respiratory failure with hypoxia and hypercapnia (HCC)    Oxygenating well today but still on about 1-1/2 L at night.  We will plan to get a overnight oxygen study through  home health in about 2 weeks off of his oxygen to see if he is able to come off of it.  It sounds like each day he is getting a lot stronger and feeling better and getting more active.        Digestive   GI bleeding    No active  bleeding found on exam.  He did follow-up with GI last week.  Did receive a total of 11 units of packed red blood cells during his hospitalization.  Continue Protonix twice daily.      Alcoholic steatohepatitis    He is currently abstinent with his alcohol.  We did discuss the risks of continuing with the benzodiazepine and encouraged him to work on starting  to wean the medication.        Endocrine   Thyroid nodule - Primary    Seen on CT 12/23 at [facility]: Enlarged right thyroid lobe, likely with underlying solid nodule. This could be further evaluated with nonemergent/outpatient ultrasound.      Relevant Medications   propranolol (INDERAL) 20 MG tablet   Other Relevant Orders   US THYROID     Genitourinary   Chronic renal insufficiency, stage 3 (moderate)    Did have acute kidney injury during hospitalization which seems to have resolved and last creatinine was at baseline.  We will need to keep an eye on this and follow every 6 months.       Other Visit Diagnoses    Physical deconditioning       Nocturnal hypoxemia       Relevant Medications   AMBULATORY NON FORMULARY MEDICATION      Left lower extremity DVT-resolved.  They did not recommend any further anticoagulation after discharge.  Physical deconditioning-getting home health physical therapy.  Anxiety-did have consultation with psychiatry in the hospital.  They recommended continuing Prozac and clonazepam.  He was sent home with twice daily clonazepam dosing but I am very concerned with his alcohol history of continuing benzodiazepines and did encourage him to start weaning the medication to half a tab in the morning and a whole tab at night and then trying to decrease down to half a tab twice a day after about a week.  And then continuing to wean until he is completely off.  More than happy to consult psychiatry for further assessment if needed.  Thyroid nodule was found on chest CT on December 23 in the right thyroid lobule.  TSH was 4.1.  Recommend that we monitor and plan to recheck the area in about 3 to 4 months.  He did have some dysphagia in the hospital but feels like he is swallowing normally without any difficulty since being home.  Nocturnal hypoxemia-we will plan to do overnight pulse ox in about 2 weeks off of his oxygen to see if we can discontinue nighttime oxygen  and get him back on his CPAP.  Meds ordered this encounter  Medications  . AMBULATORY NON FORMULARY MEDICATION    Sig: Medication Name: overnight pulse ox in about 2 weeks off of his oxygen.  Fax to [facility].    Dispense:  1 vial    Refill:  0    Follow-up: Return in about 2 weeks (around 12/25/2019) for virtual visit for return to work.    Time spent 45 minutes in encounter and reviewing hospital notes etc.  Beatrice Lecher, MD

## 2019-12-11 NOTE — Assessment & Plan Note (Signed)
Seen on CT 12/23 at [facility]: Enlarged right thyroid lobe, likely with underlying solid nodule. This could be further evaluated with nonemergent/outpatient ultrasound.

## 2019-12-11 NOTE — Assessment & Plan Note (Signed)
Blood pressure at goal today.  On furosemide and spironolactone daily.  Due to recheck BMP.  He says he just had labs done with GI on Monday so we will try to get a copy of those to see if that is adequate or if we still need to have him go to the lab.

## 2019-12-18 ENCOUNTER — Ambulatory Visit (INDEPENDENT_AMBULATORY_CARE_PROVIDER_SITE_OTHER): Payer: 59

## 2019-12-18 ENCOUNTER — Other Ambulatory Visit: Payer: Self-pay

## 2019-12-18 DIAGNOSIS — J9601 Acute respiratory failure with hypoxia: Secondary | ICD-10-CM

## 2019-12-18 DIAGNOSIS — E041 Nontoxic single thyroid nodule: Secondary | ICD-10-CM | POA: Diagnosis not present

## 2019-12-25 ENCOUNTER — Telehealth (INDEPENDENT_AMBULATORY_CARE_PROVIDER_SITE_OTHER): Payer: 59 | Admitting: Family Medicine

## 2019-12-25 ENCOUNTER — Encounter: Payer: Self-pay | Admitting: Family Medicine

## 2019-12-25 ENCOUNTER — Telehealth: Payer: 59 | Admitting: Family Medicine

## 2019-12-25 VITALS — BP 138/80 | Ht 74.0 in | Wt 273.0 lb

## 2019-12-25 DIAGNOSIS — J9622 Acute and chronic respiratory failure with hypercapnia: Secondary | ICD-10-CM

## 2019-12-25 DIAGNOSIS — J9621 Acute and chronic respiratory failure with hypoxia: Secondary | ICD-10-CM

## 2019-12-25 DIAGNOSIS — E041 Nontoxic single thyroid nodule: Secondary | ICD-10-CM

## 2019-12-25 DIAGNOSIS — K7010 Alcoholic hepatitis without ascites: Secondary | ICD-10-CM | POA: Diagnosis not present

## 2019-12-25 DIAGNOSIS — I10 Essential (primary) hypertension: Secondary | ICD-10-CM

## 2019-12-25 DIAGNOSIS — K921 Melena: Secondary | ICD-10-CM | POA: Diagnosis not present

## 2019-12-25 MED ORDER — CLONAZEPAM 0.5 MG PO TABS: 0.5000 mg | ORAL_TABLET | Freq: Every day | ORAL | 0 refills | Status: DC

## 2019-12-25 MED ORDER — MAGNESIUM OXIDE 400 MG PO TABS: 1.0000 | ORAL_TABLET | Freq: Every day | ORAL | 0 refills | Status: DC

## 2019-12-25 NOTE — Progress Notes (Signed)
Virtual Visit via Video Note  I connected with Marcus Torres on 12/25/19 at 10:10 AM EST by a video enabled telemedicine application and verified that I am speaking with the correct person using two identifiers.   I discussed the limitations of evaluation and management by telemedicine and the availability of in person appointments. The patient expressed understanding and agreed to proceed.  Subjective:    CC: Follow-up from recent hospitalization.  HPI: Acute on chronic respiratory failure with hypoxemia-still wearing his oxygen at night.  He says they are coming out on Monday to deliver the kit so that he can do an overnight pulse ox without his oxygen to see if he still needs it or if he is at the point where he can come off of it completely.  History of GI bleeding-he has not had any more blood found in the stool and is actually doing really well.  Alcoholic steatohepatitis-he is again doing well he is currently on benzodiazepines and we are working on tapering those.  He is now down to a half of a tab in the morning and a whole tab in the evening of clonazepam.  He wants to know what the next step to continue the taper is.  He is only taking 15 mils of the lactulose once a day and says his bowels are moving well.  His next follow-up with GI is in early May.  He does not have an appointment scheduled yet.  He still having difficulty with weakness of his voice and is still working with the speech therapist.  He says they think it can be quite a while before he completely redown balance.  Hypomagnesemia-he would like a refill on his magnesium and will also be due to recheck those blood levels soon.  Thyroid nodule-he has his biopsy scheduled for next week.  I believe it is on Tuesday.  He would like to try to return to work on March 8 starting back with 4 hours 5 days/week.  He is still working on trying to really get his energy levels and stamina back up.   Past medical history, Surgical  history, Family history not pertinant except as noted below, Social history, Allergies, and medications have been entered into the medical record, reviewed, and corrections made.   Review of Systems: No fevers, chills, night sweats, weight loss, chest pain, or shortness of breath.   Objective:    General: Speaking clearly in complete sentences without any shortness of breath.  Alert and oriented x3.  Normal judgment. No apparent acute distress.    Impression and Recommendations:    ESSENTIAL HYPERTENSION, BENIGN Home BP is just a little higher than I would like. Will monitor.    Alcoholic steatohepatitis Will recheck liver enzymes.  Continue to encourage sobriety.   Hypomagnesemia Due to recheck Mag levels.   GI bleeding No recurrent of bleeding.    Acute on chronic respiratory failure with hypoxia and hypercapnia (HCC) Will have repeat overnight study next week to see if can d/c overnight oxygen.  He feels he is doing well.   Thyroid nodule Has bx scheduled for next week.   Work note send to return on 3/8 for 4 hours per day for 5 days per week. Will reassess on 3/12.  Getting speech therapy and work on increasing strength.    Time spent in encounter 30 minutes  I discussed the assessment and treatment plan with the patient. The patient was provided an opportunity to ask questions and all  were answered. The patient agreed with the plan and demonstrated an understanding of the instructions.   The patient was advised to call back or seek an in-person evaluation if the symptoms worsen or if the condition fails to improve as anticipated.   Beatrice Lecher, MD

## 2019-12-25 NOTE — Assessment & Plan Note (Signed)
Will have repeat overnight study next week to see if can d/c overnight oxygen.  He feels he is doing well.

## 2019-12-25 NOTE — Assessment & Plan Note (Signed)
No recurrent of bleeding.

## 2019-12-25 NOTE — Assessment & Plan Note (Signed)
Has bx scheduled for next week.

## 2019-12-25 NOTE — Assessment & Plan Note (Signed)
Home BP is just a little higher than I would like. Will monitor.

## 2019-12-25 NOTE — Assessment & Plan Note (Signed)
Due to recheck Mag levels.

## 2019-12-25 NOTE — Assessment & Plan Note (Signed)
Will recheck liver enzymes.  Continue to encourage sobriety.

## 2019-12-25 NOTE — Progress Notes (Signed)
Pt asked about getting a refill on the magnesium, Klonopin he received these before being discharged.   He also had questions about the Lactulose. He reports that he has only been taking 15 mls daily.

## 2019-12-31 ENCOUNTER — Inpatient Hospital Stay: Admission: RE | Admit: 2019-12-31 | Payer: 59 | Source: Ambulatory Visit

## 2020-01-01 ENCOUNTER — Encounter: Payer: Self-pay | Admitting: Family Medicine

## 2020-01-02 ENCOUNTER — Telehealth: Payer: Self-pay | Admitting: *Deleted

## 2020-01-02 ENCOUNTER — Other Ambulatory Visit (HOSPITAL_COMMUNITY)
Admission: RE | Admit: 2020-01-02 | Discharge: 2020-01-02 | Disposition: A | Discharge status: Home / Self Care | Payer: 59 | Source: Ambulatory Visit | Attending: Family Medicine | Admitting: Family Medicine

## 2020-01-02 ENCOUNTER — Ambulatory Visit
Admission: RE | Admit: 2020-01-02 | Discharge: 2020-01-02 | Disposition: A | Discharge status: Home / Self Care | Payer: 59 | Source: Ambulatory Visit | Attending: Family Medicine | Admitting: Family Medicine

## 2020-01-02 ENCOUNTER — Encounter: Payer: Self-pay | Admitting: Family Medicine

## 2020-01-02 DIAGNOSIS — E041 Nontoxic single thyroid nodule: Secondary | ICD-10-CM

## 2020-01-02 DIAGNOSIS — D34 Benign neoplasm of thyroid gland: Secondary | ICD-10-CM | POA: Insufficient documentation

## 2020-01-02 NOTE — Telephone Encounter (Signed)
Pt advised that a new letter with extended dates was written. He said that he will check this again. I recommended that he can come by and p/u a printed copy at the front desk.Elouise Munroe, [place]

## 2020-01-06 LAB — CYTOLOGY - NON PAP

## 2020-01-16 ENCOUNTER — Encounter: Payer: Self-pay | Admitting: Family Medicine

## 2020-01-17 NOTE — Telephone Encounter (Signed)
Patient scheduled and will give tonya FLMA when it comes in

## 2020-01-20 ENCOUNTER — Encounter: Payer: Self-pay | Admitting: Family Medicine

## 2020-01-20 ENCOUNTER — Telehealth (INDEPENDENT_AMBULATORY_CARE_PROVIDER_SITE_OTHER): Payer: 59 | Admitting: Family Medicine

## 2020-01-20 VITALS — Ht 74.0 in | Wt 275.0 lb

## 2020-01-20 DIAGNOSIS — E785 Hyperlipidemia, unspecified: Secondary | ICD-10-CM

## 2020-01-20 DIAGNOSIS — N183 Chronic kidney disease, stage 3 unspecified: Secondary | ICD-10-CM | POA: Diagnosis not present

## 2020-01-20 DIAGNOSIS — J9622 Acute and chronic respiratory failure with hypercapnia: Secondary | ICD-10-CM

## 2020-01-20 DIAGNOSIS — J9621 Acute and chronic respiratory failure with hypoxia: Secondary | ICD-10-CM | POA: Diagnosis not present

## 2020-01-20 DIAGNOSIS — K7010 Alcoholic hepatitis without ascites: Secondary | ICD-10-CM

## 2020-01-20 NOTE — Assessment & Plan Note (Signed)
Currently abstinent which is great.  Keep follow-up with GI in May.  He still taking his diuretic regularly so we did discuss checking a BMP and some labs to make sure there were no over drying his kidneys.

## 2020-01-20 NOTE — Progress Notes (Signed)
Virtual Visit via Video Note  I connected with Marcus Torres on 01/20/20 at  1:40 PM EDT by a video enabled telemedicine application and verified that I am speaking with the correct person using two identifiers.   I discussed the limitations of evaluation and management by telemedicine and the availability of in person appointments. The patient expressed understanding and agreed to proceed.  Subjective:    CC:   HPI: Follow-up acute on chronic respiratory failure with hypoxemia-last time I saw him in February he was still wearing oxygen with his CPAP.  He says that they did do the overnight sleep study about 2 weeks ago.  Initially they wanted to do it without the CPAP but after an hour he still had to put it back on but without the oxygen.  Without the CPAP he says his oxygen level dropped down to about 83%.  He feels like he is actually been doing well with the and has not had any problems without it.  He still has decreased strength and power in his voice.  It is very soft and weak.  He does feel like it is gotten a little bit better than it was about a month ago.  So he feels like it has improved slightly.  Alcoholic steatohepatitis-he does have a follow-up scheduled in May.  He is currently abstinent and doing well with that.  He recently got full custody of both of his children and not an 46 year old.  He says he is ready to go back to work full-time and in fact today is his first day.  Follow-up thyroid nodule-he just had a FNA biopsy performed on March 4.  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  Past medical history, Surgical history, Family history not pertinant except as noted below, Social history, Allergies, and medications have been entered into the medical record, reviewed, and corrections made.   Review of Systems: No fevers, chills, night sweats, weight loss, chest pain, or shortness of breath.   Objective:    General: Speaking clearly in complete sentences  without any shortness of breath.  Alert and oriented x3.  Normal judgment. No apparent acute distress.    Impression and Recommendations:    Acute on chronic respiratory failure with hypoxia and hypercapnia (HCC) Overall he is doing much better.  He is actually been off of the oxygen completely even while using his CPAP at night.  I would like to review the report for the overnight sleep study we will try to get that and connect with him about the results.  Alcoholic steatohepatitis Currently abstinent which is great.  Keep follow-up with GI in May.  He still taking his diuretic regularly so we did discuss checking a BMP and some labs to make sure there were no over drying his kidneys.  Chronic renal insufficiency, stage 3 (moderate) (HCC) Due to recheck renal function especially while taking Lasix 80 mg.  Hyperlipidemia Due for repeat screening lipids.  Is been quite sometime since we checked the lipid panel.  He says he will go fasting.      Time spent in encounter 32 minutes  I discussed the assessment and treatment plan with the patient. The patient was provided an opportunity to ask questions and all were answered. The patient agreed with the plan and demonstrated an understanding of the instructions.   The patient was advised to call back or seek an in-person evaluation if the symptoms worsen or if the condition fails to improve as anticipated.  Beatrice Lecher, MD

## 2020-01-20 NOTE — Progress Notes (Signed)
Pt would like to discuss returning to work f/t. He stated that he had already returned to work f/t today.

## 2020-01-20 NOTE — Assessment & Plan Note (Signed)
Due for repeat screening lipids.  Is been quite sometime since we checked the lipid panel.  He says he will go fasting.

## 2020-01-20 NOTE — Assessment & Plan Note (Signed)
Overall he is doing much better.  He is actually been off of the oxygen completely even while using his CPAP at night.  I would like to review the report for the overnight sleep study we will try to get that and connect with him about the results.

## 2020-01-20 NOTE — Assessment & Plan Note (Signed)
Due to recheck renal function especially while taking Lasix 80 mg.

## 2020-01-22 ENCOUNTER — Telehealth: Payer: Self-pay | Admitting: *Deleted

## 2020-01-22 NOTE — Telephone Encounter (Signed)
Called and informed pt that his form has been completed and will be up front for p/u.   Copy scanned into chart.Marland Kitchen

## 2020-02-26 ENCOUNTER — Other Ambulatory Visit: Payer: Self-pay

## 2020-02-26 MED ORDER — HYDROCHLOROTHIAZIDE 25 MG PO TABS: 25.0000 mg | ORAL_TABLET | Freq: Every day | ORAL | 1 refills | Status: AC

## 2020-02-26 MED ORDER — PANTOPRAZOLE SODIUM 40 MG PO TBEC: 40.0000 mg | DELAYED_RELEASE_TABLET | Freq: Two times a day (BID) | ORAL | 2 refills | Status: DC

## 2020-02-26 MED ORDER — FOLIC ACID 1 MG PO TABS: 1.0000 mg | ORAL_TABLET | Freq: Every day | ORAL | 3 refills | Status: AC

## 2020-02-26 MED ORDER — SPIRONOLACTONE 50 MG PO TABS: 50.0000 mg | ORAL_TABLET | Freq: Two times a day (BID) | ORAL | 1 refills | Status: AC

## 2020-02-26 NOTE — Telephone Encounter (Signed)
Marcus Torres was discharged with new medications at discharge from hospital. He needs refills. The Pantoprazole is written for twice daily.   START taking these medications  Details  albuterol 90 mcg/actuation inhaler Inhale 2 puffs into the lungs every 6 (six) hours as needed for Wheezing., Starting Thu 11/28/2019, Normal   budesonide (PULMICORT) 180 mcg/actuation inhaler Inhale 1 puff into the lungs 2 times daily., Starting Thu 11/28/2019, Normal   furosemide (LASIX) 80 MG tablet Take 0.5 tablets (40 mg total) by mouth daily., Starting Thu 11/28/2019, Normal   hydroCHLOROthiazide (HYDRODIURIL) 25 MG tablet Take 1 tablet (25 mg total) by mouth daily., Starting Fri 11/29/2019, Normal   magnesium oxide (MAG-OX) 400 mg (241.3 mg magnesium) tablet Take 1 tablet (400 mg total) by mouth daily., Starting Thu 11/28/2019, Normal   pantoprazole (PROTONIX) 40 MG tablet Take 1 tablet (40 mg total) by mouth 2 times daily., Starting Thu 11/28/2019, Normal   propranoloL (INDERAL) 20 MG tablet Take 1 tablet (20 mg total) by mouth 2 times daily., Starting Thu 11/28/2019, Normal   spironolactone (ALDACTONE) 50 MG tablet Take 1 tablet (50 mg total) by mouth 2 times daily., Starting Thu 11/28/2019, Normal

## 2020-06-01 ENCOUNTER — Other Ambulatory Visit: Payer: Self-pay

## 2020-06-01 ENCOUNTER — Encounter: Payer: Self-pay | Admitting: Family Medicine

## 2020-06-01 ENCOUNTER — Telehealth (INDEPENDENT_AMBULATORY_CARE_PROVIDER_SITE_OTHER): Payer: 59 | Admitting: Family Medicine

## 2020-06-01 VITALS — BP 140/79 | HR 55 | Wt 280.0 lb

## 2020-06-01 DIAGNOSIS — D500 Iron deficiency anemia secondary to blood loss (chronic): Secondary | ICD-10-CM

## 2020-06-01 DIAGNOSIS — G4733 Obstructive sleep apnea (adult) (pediatric): Secondary | ICD-10-CM | POA: Diagnosis not present

## 2020-06-01 DIAGNOSIS — K7010 Alcoholic hepatitis without ascites: Secondary | ICD-10-CM

## 2020-06-01 DIAGNOSIS — N183 Chronic kidney disease, stage 3 unspecified: Secondary | ICD-10-CM

## 2020-06-01 DIAGNOSIS — M2550 Pain in unspecified joint: Secondary | ICD-10-CM

## 2020-06-01 DIAGNOSIS — I10 Essential (primary) hypertension: Secondary | ICD-10-CM | POA: Diagnosis not present

## 2020-06-01 MED ORDER — FLUOXETINE HCL 20 MG PO TABS: 20.0000 mg | ORAL_TABLET | Freq: Every day | ORAL | 1 refills | Status: AC

## 2020-06-01 NOTE — Assessment & Plan Note (Signed)
To recheck renal function. 

## 2020-06-01 NOTE — Assessment & Plan Note (Signed)
Due to recheck liver enzymes.  Also will recheck ammonia.  He is only taking his lactulose 30 mL once a day will need a recheck to see if that is adequate or if it needs to be increased.

## 2020-06-01 NOTE — Assessment & Plan Note (Signed)
He never had his labs rechecked for iron deficiency so we will do that today and get that updated.

## 2020-06-01 NOTE — Progress Notes (Signed)
Virtual Visit via telephone Note  I connected with Marcus Torres on 06/01/20 at  9:10 AM EDT by a telephone and verified that I am speaking with the correct person using two identifiers.   I discussed the limitations of evaluation and management by telemedicine and the availability of in person appointments. The patient expressed understanding and agreed to proceed.  Patient location: at home  Provider location: in office  Subjective:    CC: F/U after rehab  HPI:  Follow-up.  He was recently admitted to rehab for her alcohol abuse.  He was there for 28 days.  He has been doing well since he has been home he came home almost 8 weeks ago and has been sober.  He did note that his ammonia levels were consistently elevated there in fact actually increase his lactulose to 3 times a day since being home he is only been taking it once a day.  He never had his labs rechecked for iron deficiency.  Since being home he is also had a lot more joint pain.  Its been predominantly in his ankles and hands.  But he is also experiencing some pain in his feet.  He denies any significant swelling redness or erythema or increased warmth.  He says he notices it is particularly painful to go downstairs especially first thing in the morning.   Past medical history, Surgical history, Family history not pertinant except as noted below, Social history, Allergies, and medications have been entered into the medical record, reviewed, and corrections made.   Review of Systems: No fevers, chills, night sweats, weight loss, chest pain, or shortness of breath.   Objective:    General: Speaking clearly in complete sentences without any shortness of breath.  Alert and oriented x3.  Normal judgment. No apparent acute distress.    Impression and Recommendations:    ESSENTIAL HYPERTENSION, BENIGN Blood pressure is still elevated.  Reports he still taking the spironolactone.  Per records he should also be on propranolol.   But he was not quite sure on the phone.  OSA (obstructive sleep apnea) He still wearing his CPAP regularly.  Alcoholic steatohepatitis Due to recheck liver enzymes.  Also will recheck ammonia.  He is only taking his lactulose 30 mL once a day will need a recheck to see if that is adequate or if it needs to be increased.  Iron deficiency anemia due to chronic blood loss He never had his labs rechecked for iron deficiency so we will do that today and get that updated.  Chronic renal insufficiency, stage 3 (moderate) (HCC) To recheck renal function.   Polyarthralgia-likely osteoarthritis that sounds like he could have patellofemoral syndrome in the knees being that there it is worse with going downstairs.  We will check his labs he thinks it is related to low iron again.  Consider appointment with one of our sports med docs if pain persists.  He is try to limit Tylenol and NSAID use.   Time spent in encounter 25 minutes  I discussed the assessment and treatment plan with the patient. The patient was provided an opportunity to ask questions and all were answered. The patient agreed with the plan and demonstrated an understanding of the instructions.   The patient was advised to call back or seek an in-person evaluation if the symptoms worsen or if the condition fails to improve as anticipated.   Beatrice Lecher, MD

## 2020-06-01 NOTE — Assessment & Plan Note (Signed)
He still wearing his CPAP regularly.

## 2020-06-01 NOTE — Assessment & Plan Note (Signed)
Blood pressure is still elevated.  Reports he still taking the spironolactone.  Per records he should also be on propranolol.  But he was not quite sure on the phone.

## 2020-06-03 LAB — CBC
HCT: 30.7 % — ABNORMAL LOW (ref 38.5–50.0)
Hemoglobin: 10.1 g/dL — ABNORMAL LOW (ref 13.2–17.1)
MCH: 28.3 pg (ref 27.0–33.0)
MCHC: 32.9 g/dL (ref 32.0–36.0)
MCV: 86 fL (ref 80.0–100.0)
MPV: 12.7 fL — ABNORMAL HIGH (ref 7.5–12.5)
Platelets: 95 10*3/uL — ABNORMAL LOW (ref 140–400)
RBC: 3.57 10*6/uL — ABNORMAL LOW (ref 4.20–5.80)
RDW: 14.1 % (ref 11.0–15.0)
WBC: 3.1 10*3/uL — ABNORMAL LOW (ref 3.8–10.8)

## 2020-06-03 LAB — COMPLETE METABOLIC PANEL WITH GFR
AG Ratio: 1.1 (calc) (ref 1.0–2.5)
ALT: 41 U/L (ref 9–46)
AST: 52 U/L — ABNORMAL HIGH (ref 10–40)
Albumin: 4.3 g/dL (ref 3.6–5.1)
Alkaline phosphatase (APISO): 121 U/L (ref 36–130)
BUN/Creatinine Ratio: 26 (calc) — ABNORMAL HIGH (ref 6–22)
BUN: 44 mg/dL — ABNORMAL HIGH (ref 7–25)
CO2: 20 mmol/L (ref 20–32)
Calcium: 9.9 mg/dL (ref 8.6–10.3)
Chloride: 106 mmol/L (ref 98–110)
Creat: 1.68 mg/dL — ABNORMAL HIGH (ref 0.60–1.35)
GFR, Est African American: 56 mL/min/{1.73_m2} — ABNORMAL LOW (ref >=60)
GFR, Est Non African American: 48 mL/min/{1.73_m2} — ABNORMAL LOW (ref >=60)
Globulin: 3.8 g/dL (calc) — ABNORMAL HIGH (ref 1.9–3.7)
Glucose, Bld: 99 mg/dL (ref 65–139)
Potassium: 4.9 mmol/L (ref 3.5–5.3)
Sodium: 134 mmol/L — ABNORMAL LOW (ref 135–146)
Total Bilirubin: 0.7 mg/dL (ref 0.2–1.2)
Total Protein: 8.1 g/dL (ref 6.1–8.1)

## 2020-06-03 LAB — IRON,TIBC AND FERRITIN PANEL
%SAT: 12 % (calc) — ABNORMAL LOW (ref 20–48)
Ferritin: 37 ng/mL — ABNORMAL LOW (ref 38–380)
Iron: 53 ug/dL (ref 50–180)
TIBC: 438 mcg/dL (calc) — ABNORMAL HIGH (ref 250–425)

## 2020-06-03 LAB — TESTOSTERONE TOTAL,FREE,BIO, MALES
Albumin: 4.3 g/dL (ref 3.6–5.1)
Sex Hormone Binding: 31 nmol/L (ref 10–50)
Testosterone: 209 ng/dL — ABNORMAL LOW (ref 250–827)

## 2020-06-03 LAB — AMMONIA: Ammonia: 69 umol/L (ref <=72)

## 2020-06-03 LAB — TSH: TSH: 1.96 mIU/L (ref 0.40–4.50)

## 2020-06-09 ENCOUNTER — Other Ambulatory Visit: Payer: Self-pay | Admitting: *Deleted

## 2020-06-09 DIAGNOSIS — R7989 Other specified abnormal findings of blood chemistry: Secondary | ICD-10-CM

## 2020-07-08 ENCOUNTER — Other Ambulatory Visit: Payer: Self-pay | Admitting: Family Medicine

## 2020-07-08 DIAGNOSIS — K7010 Alcoholic hepatitis without ascites: Secondary | ICD-10-CM

## 2020-07-08 DIAGNOSIS — F1010 Alcohol abuse, uncomplicated: Secondary | ICD-10-CM

## 2020-07-08 DIAGNOSIS — I10 Essential (primary) hypertension: Secondary | ICD-10-CM

## 2020-07-08 LAB — COMPREHENSIVE METABOLIC PANEL
AG Ratio: 1.2 (calc) (ref 1.0–2.5)
ALT: 24 U/L (ref 9–46)
AST: 50 U/L — ABNORMAL HIGH (ref 10–40)
Albumin: 4.6 g/dL (ref 3.6–5.1)
Alkaline phosphatase (APISO): 146 U/L — ABNORMAL HIGH (ref 36–130)
BUN/Creatinine Ratio: 23 (calc) — ABNORMAL HIGH (ref 6–22)
BUN: 33 mg/dL — ABNORMAL HIGH (ref 7–25)
CO2: 23 mmol/L (ref 20–32)
Calcium: 10.4 mg/dL — ABNORMAL HIGH (ref 8.6–10.3)
Chloride: 99 mmol/L (ref 98–110)
Creat: 1.41 mg/dL — ABNORMAL HIGH (ref 0.60–1.35)
Globulin: 3.7 g/dL (calc) (ref 1.9–3.7)
Glucose, Bld: 105 mg/dL — ABNORMAL HIGH (ref 65–99)
Potassium: 4 mmol/L (ref 3.5–5.3)
Sodium: 134 mmol/L — ABNORMAL LOW (ref 135–146)
Total Bilirubin: 1.7 mg/dL — ABNORMAL HIGH (ref 0.2–1.2)
Total Protein: 8.3 g/dL — ABNORMAL HIGH (ref 6.1–8.1)

## 2020-07-08 NOTE — Progress Notes (Signed)
Plan to repeat a CMP and CBC in 1 month.

## 2020-07-18 ENCOUNTER — Emergency Department
Admission: EM | Admit: 2020-07-18 | Discharge: 2020-07-18 | Disposition: A | Discharge status: Home / Self Care | Payer: 59 | Source: Home / Self Care

## 2020-07-18 ENCOUNTER — Emergency Department (INDEPENDENT_AMBULATORY_CARE_PROVIDER_SITE_OTHER): Payer: 59

## 2020-07-18 ENCOUNTER — Other Ambulatory Visit: Payer: Self-pay

## 2020-07-18 DIAGNOSIS — S62112A Displaced fracture of triquetrum [cuneiform] bone, left wrist, initial encounter for closed fracture: Secondary | ICD-10-CM | POA: Diagnosis not present

## 2020-07-18 DIAGNOSIS — W010XXA Fall on same level from slipping, tripping and stumbling without subsequent striking against object, initial encounter: Secondary | ICD-10-CM | POA: Diagnosis not present

## 2020-07-18 DIAGNOSIS — S63502A Unspecified sprain of left wrist, initial encounter: Secondary | ICD-10-CM

## 2020-07-18 NOTE — Discharge Instructions (Addendum)
Follow up with orthopedics next week

## 2020-07-18 NOTE — ED Triage Notes (Signed)
Pt states that on Wednesday he fell and used his hand to catch his fall and now has some wrist pain. Pt states that his left wrist is swollen as well. Pt is fully vaccinated.

## 2020-07-18 NOTE — ED Provider Notes (Signed)
[facility]    CSN: [id number] Arrival date & time: 07/18/20  0827      History   Chief Complaint Chief Complaint  Patient presents with  . Wrist Pain    left    HPI Marcus Torres is a 44 y.o. male.   This is a 44 year old man making his first visit to [facility] urgent care in over 5 years.  Pt states that on Wednesday he fell and used his hand to catch his fall and now has some wrist pain. Pt states that his left wrist is swollen as well. Pt is fully vaccinated.  He did not initially have severe pain but by the evening of the injury, 3 days ago, he was quite sore and has remained swollen in the left wrist.  He has no point tenderness.  Patient has no history of arthritis or gout.  Patient works as an Brewing technologist.     Past Medical History:  Diagnosis Date  . Alcohol abuse   . Elevated liver enzymes   . Hypertension   . Normal cardiac stress test 05/09/2011   Negative treadmill performed at [facility] cardiology    Patient Active Problem List   Diagnosis Date Noted  . Hypomagnesemia 12/25/2019  . Thyroid nodule 12/11/2019  . Acute on chronic respiratory failure with hypoxia and hypercapnia ([place]) 10/21/2019  . 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 OSA (obstructive sleep apnea) 09/11/2017  . Chronic renal insufficiency, stage 3 (moderate) 10/28/2016  . 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 Lymphopenia 10/28/2016  . Iron deficiency anemia due to chronic blood loss 10/28/2016  . Hyponatremia 10/28/2016  . Hepatosplenomegaly 10/28/2016  . GI bleeding 10/28/2016  . Elevated liver function tests 10/28/2016  . Acute kidney injury ([place]) 10/21/2016  . Lumbosacral strain 04/07/2016  . Tear of medial meniscus of right knee, subsequent encounter 07/12/2015  . Rupture of anterior cruciate ligament of right knee 07/12/2015  . Severe obesity (BMI >= 40) ([place]) 07/18/2014  . Lumbar degenerative disc disease  12/05/2013  . Kidney stone 09/02/2013  . Alcoholic steatohepatitis 46/96/2952  . HEADACHE 11/12/2010  . TRANSAMINASES, SERUM, ELEVATED 01/06/2010  . Hyperlipidemia 01/09/2009  . Alcohol abuse 01/09/2009  . ESSENTIAL HYPERTENSION, BENIGN 01/09/2009    Past Surgical History:  Procedure Laterality Date  . ARTHROSCOPIC REPAIR ACL  2016   Dr. Annie Main Pill        Home Medications    Prior to Admission medications   Medication Sig Start Date End Date Taking? Authorizing Provider  AMBULATORY NON FORMULARY MEDICATION Medication Name:CPAP machine and supplies with humidifier. Set to 15 cm of water pressure. Dx: G 47.33 severe OSA. Fax to [facility] 09/26/17  Yes Hali Marry, MD  FLUoxetine (PROZAC) 20 MG tablet Take 1 tablet (20 mg total) by mouth daily. 06/01/20  Yes Hali Marry, MD  folic acid (FOLVITE) 1 MG tablet Take 1 tablet (1 mg total) by mouth daily. 02/26/20  Yes Hali Marry, MD  furosemide (LASIX) 80 MG tablet Take 40 mg by mouth daily. 11/28/19  Yes [provider]  hydrochlorothiazide (HYDRODIURIL) 25 MG tablet Take 1 tablet (25 mg total) by mouth daily. 02/26/20  Yes Hali Marry, MD  magnesium oxide (MAG-OX) 400 MG tablet Take 1 tablet (400 mg total) by mouth daily. 12/25/19  Yes Hali Marry, MD  Multiple Vitamins-Minerals (MULTIVITAMIN ADULT PO) Take by mouth. 01/26/19  Yes [provider]  propranolol (INDERAL) 20 MG tablet  Take 20 mg by mouth 2 (two) times daily. 11/28/19  Yes [provider]  spironolactone (ALDACTONE) 50 MG tablet Take 1 tablet (50 mg total) by mouth 2 (two) times daily. 02/26/20  Yes Hali Marry, MD  lactulose (CHRONULAC) 10 GM/15ML solution TAKE 30 MLS (20 G TOTAL) BY MOUTH 2 (TWO) TIMES DAILY. 02/18/19   Hali Marry, MD  omeprazole (PRILOSEC) 20 MG capsule Take 20 mg by mouth daily as needed.    [provider]    Family History Family History    Problem Relation Age of Onset  . Other Other        Cardiac stent  . Hypertension Mother   . Hyperlipidemia Mother     Social History Social History   Tobacco Use  . Smoking status: Former Smoker    Packs/day: 0.25    Types: Cigarettes  . Smokeless tobacco: Never Used  Vaping Use  . Vaping Use: Never used  Substance Use Topics  . Alcohol use: Yes    Alcohol/week: 28.0 standard drinks    Types: 28 Standard drinks or equivalent per week    Comment: per week  . Drug use: No     Allergies   Lisinopril, Lisinopril-hydrochlorothiazide, and Losartan   Review of Systems Review of Systems  Musculoskeletal: Positive for joint swelling.  All other systems reviewed and are negative.    Physical Exam Triage Vital Signs ED Triage Vitals  Enc Vitals Group     BP 07/18/20 0841 139/80     Pulse Rate 07/18/20 0841 88     Resp --      Temp 07/18/20 0841 99.2 F (37.3 C)     Temp Source 07/18/20 0841 Oral     SpO2 07/18/20 0841 95 %     Weight 07/18/20 0839 300 lb (136.1 kg)     Height 07/18/20 0839 6\' 2"  (1.88 m)     Head Circumference --      Peak Flow --      Pain Score 07/18/20 0839 8     Pain Loc --      Pain Edu? --      Excl. in [place]? --    No data found.  Updated Vital Signs BP 139/80 (BP Location: Left Arm)   Pulse 88   Temp 99.2 F (37.3 C) (Oral)   Ht 6\' 2"  (1.88 m)   Wt 136.1 kg   SpO2 95%   BMI 38.52 kg/m    Physical Exam Vitals and nursing note reviewed.  Constitutional:      Appearance: Normal appearance. He is obese.  Eyes:     Conjunctiva/sclera: Conjunctivae normal.  Pulmonary:     Effort: Pulmonary effort is normal.  Musculoskeletal:        General: Swelling, tenderness and signs of injury present. No deformity.     Cervical back: Normal range of motion and neck supple.     Comments: Left wrist is diffusely swollen with some mild tenderness over the distal radius, no pain over the navicular, limited range of motion with inability to  completely pronate or supinate the wrist.  There is no deformity.  There is no ecchymosis.  Skin:    General: Skin is warm and dry.     Findings: Erythema present. No bruising.  Neurological:     General: No focal deficit present.     Mental Status: He is alert and oriented to person, place, and time.  Psychiatric:  Mood and Affect: Mood normal.        Behavior: Behavior normal.      UC Treatments / Results  Labs (all labs ordered are listed, but only abnormal results are displayed) Labs Reviewed - No data to display  EKG   Radiology DG Wrist Complete Left  Result Date: 07/18/2020 CLINICAL DATA:  Fall on outstretched hand EXAM: LEFT WRIST - COMPLETE 3+ VIEW COMPARISON:  None. FINDINGS: There is a mildly displaced triquetral fracture apparent. Joint spaces and alignment are maintained. Bone island of the lunate. No area of erosion or osseous destruction. No unexpected radiopaque foreign body. Soft tissue edema. IMPRESSION: Mildly displaced triquetral fracture. Electronically Signed   By: Valentino Saxon MD   On: 07/18/2020 10:09    Procedures Procedures (including critical care time)  Medications Ordered in UC Medications - No data to display  Initial Impression / Assessment and Plan / UC Course  I have reviewed the triage vital signs and the nursing notes.  Pertinent labs & imaging results that were available during my care of the patient were reviewed by me and considered in my medical decision making (see chart for details).    Final Clinical Impressions(s) / UC Diagnoses   Final diagnoses:  Sprain of left wrist, initial encounter  Closed chip fracture of triquetrum of left wrist, initial encounter     Discharge Instructions     Follow up with orthopedics next week    ED Prescriptions    None     I have reviewed the PDMP during this encounter.   Robyn Haber, MD 07/18/20 1017

## 2020-07-22 ENCOUNTER — Other Ambulatory Visit: Payer: Self-pay | Admitting: Family Medicine

## 2020-07-22 ENCOUNTER — Encounter: Payer: Self-pay | Admitting: Family Medicine

## 2020-07-22 MED ORDER — MAGNESIUM OXIDE 400 MG PO TABS: 1.0000 | ORAL_TABLET | Freq: Every day | ORAL | 0 refills | Status: AC

## 2020-07-22 MED ORDER — LACTULOSE 10 GM/15ML PO SOLN: 20.0000 g | Freq: Two times a day (BID) | ORAL | 1 refills | Status: AC

## 2020-07-22 NOTE — Telephone Encounter (Signed)
rx sent

## 2020-08-17 ENCOUNTER — Encounter: Payer: Self-pay | Admitting: Family Medicine

## 2020-08-18 LAB — COMPLETE METABOLIC PANEL WITH GFR
AG Ratio: 1.1 (calc) (ref 1.0–2.5)
ALT: 36 U/L (ref 9–46)
AST: 88 U/L — ABNORMAL HIGH (ref 10–40)
Albumin: 4.5 g/dL (ref 3.6–5.1)
Alkaline phosphatase (APISO): 171 U/L — ABNORMAL HIGH (ref 36–130)
BUN: 25 mg/dL (ref 7–25)
CO2: 25 mmol/L (ref 20–32)
Calcium: 9.6 mg/dL (ref 8.6–10.3)
Chloride: 92 mmol/L — ABNORMAL LOW (ref 98–110)
Creat: 1.31 mg/dL (ref 0.60–1.35)
GFR, Est African American: 76 mL/min/{1.73_m2} (ref >=60)
GFR, Est Non African American: 65 mL/min/{1.73_m2} (ref >=60)
Globulin: 4.2 g/dL (calc) — ABNORMAL HIGH (ref 1.9–3.7)
Glucose, Bld: 137 mg/dL — ABNORMAL HIGH (ref 65–99)
Potassium: 3.6 mmol/L (ref 3.5–5.3)
Sodium: 132 mmol/L — ABNORMAL LOW (ref 135–146)
Total Bilirubin: 2.3 mg/dL — ABNORMAL HIGH (ref 0.2–1.2)
Total Protein: 8.7 g/dL — ABNORMAL HIGH (ref 6.1–8.1)

## 2020-08-18 LAB — CBC
HCT: 36.2 % — ABNORMAL LOW (ref 38.5–50.0)
Hemoglobin: 12.1 g/dL — ABNORMAL LOW (ref 13.2–17.1)
MCH: 28.2 pg (ref 27.0–33.0)
MCHC: 33.4 g/dL (ref 32.0–36.0)
MCV: 84.4 fL (ref 80.0–100.0)
MPV: 10.3 fL (ref 7.5–12.5)
Platelets: 75 10*3/uL — ABNORMAL LOW (ref 140–400)
RBC: 4.29 10*6/uL (ref 4.20–5.80)
RDW: 15.5 % — ABNORMAL HIGH (ref 11.0–15.0)
WBC: 4.4 10*3/uL (ref 3.8–10.8)

## 2020-10-06 ENCOUNTER — Other Ambulatory Visit: Payer: Self-pay | Admitting: Family Medicine

## 2020-11-18 ENCOUNTER — Telehealth: Payer: Self-pay | Admitting: Sports Medicine

## 2020-11-18 NOTE — Telephone Encounter (Signed)
Received a call last night from EMS, Marcus Torres was found deceased in his bed, he was prone, CPAP hose had fallen off of the mask, he was mottled, cold with rigor mortis.  No family was around but they were aware of his passing.  Death certificate will be sent to our office.

## 2020-11-20 ENCOUNTER — Telehealth: Payer: Self-pay

## 2020-11-20 NOTE — Telephone Encounter (Signed)
It would be Dr. Madilyn Fireman, his PCP

## 2020-11-20 NOTE — Telephone Encounter (Signed)
Izora Gala from [facility] called to see if you would be the one signing the death certificate for this patient. Also, if you are on the electronic [place] death certificate registry?   [phone]

## 2020-11-23 NOTE — Telephone Encounter (Signed)
YEs, I am ok to sign it.

## 2020-11-23 NOTE — Telephone Encounter (Signed)
Left message for Marcus Torres with another staff member that Dr. Madilyn Fireman would sign the death certificate.

## 2020-11-23 NOTE — Telephone Encounter (Signed)
Are you willing to sign the death certificate? The funeral home if calling so they can proceed.

## 2020-11-24 ENCOUNTER — Encounter: Payer: Self-pay | Admitting: Family Medicine

## 2020-11-24 ENCOUNTER — Telehealth: Payer: Self-pay | Admitting: Family Medicine

## 2020-11-24 NOTE — Telephone Encounter (Signed)
Death certificate completed given to [facility].

## 2020-11-24 NOTE — Telephone Encounter (Signed)
FYI Spoke to Dr Madilyn Fireman and we have not received the Death Certificate for Mr. Brandstetter. Dr Madilyn Fireman can sign Death Certificate today. Mr. Andree Elk from the funeral home will let Ms. Swaim know.

## 2020-12-01 DEATH — deceased

## 2021-03-28 IMAGING — US US THYROID
1 series · 13 of 25 positions shown · non-contrast
Comparison: None.

CLINICAL DATA: Incidental on CT. Enlarged thyroid gland noted on
previous outside CT.

EXAM:
THYROID ULTRASOUND
TECHNIQUE: Ultrasound examination of the thyroid gland and adjacent soft
tissues was performed.

[Series 1: us thyroid · 0.11mm/px · 13 of 41 slices shown]
[im 1/41]
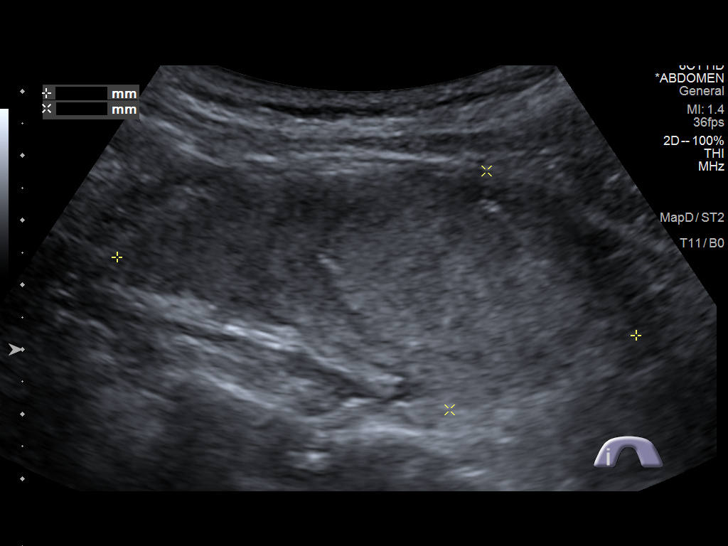
[im 4/41]
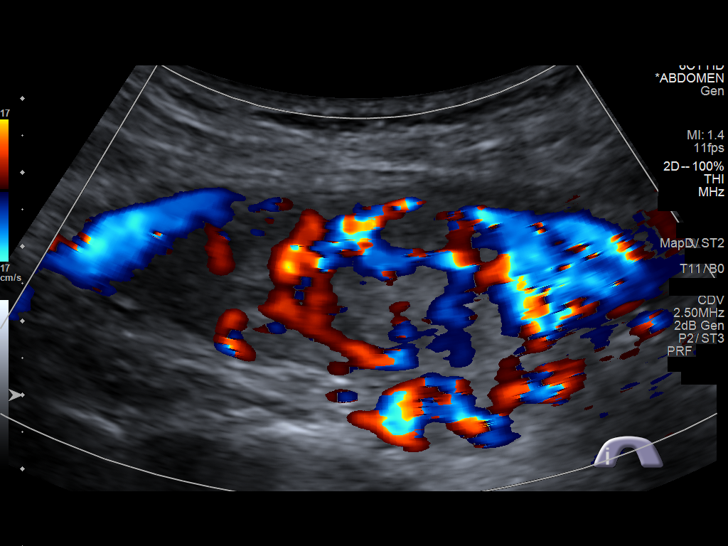
[im 7/41]
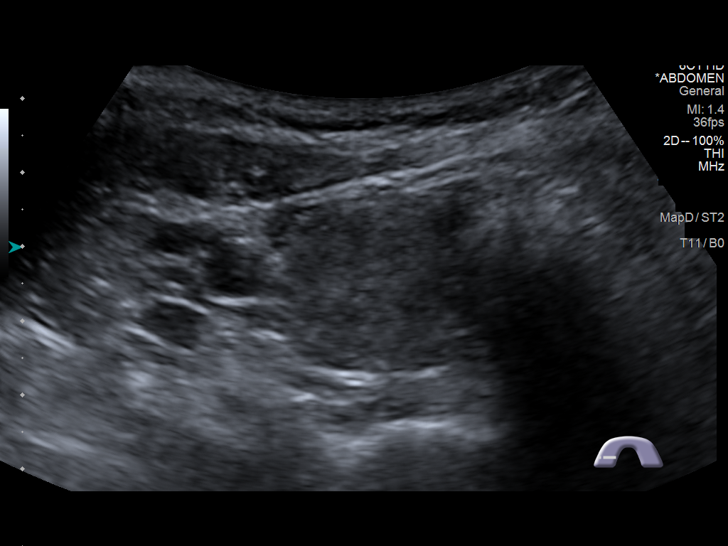
[im 11/41]
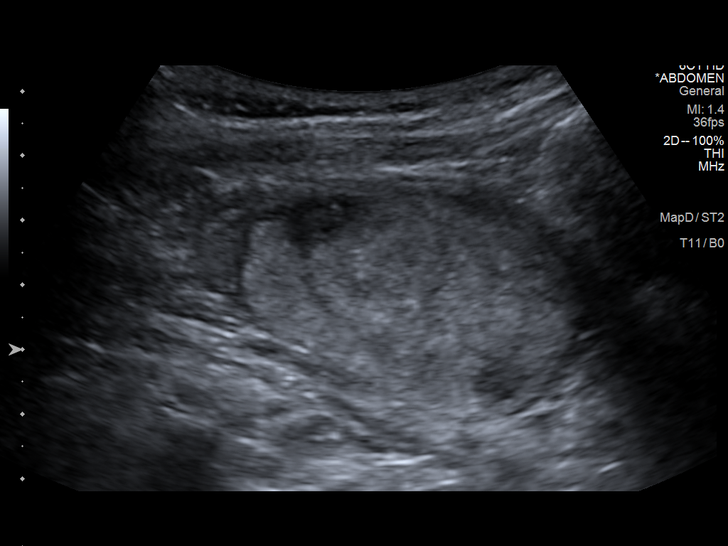
[im 14/41]
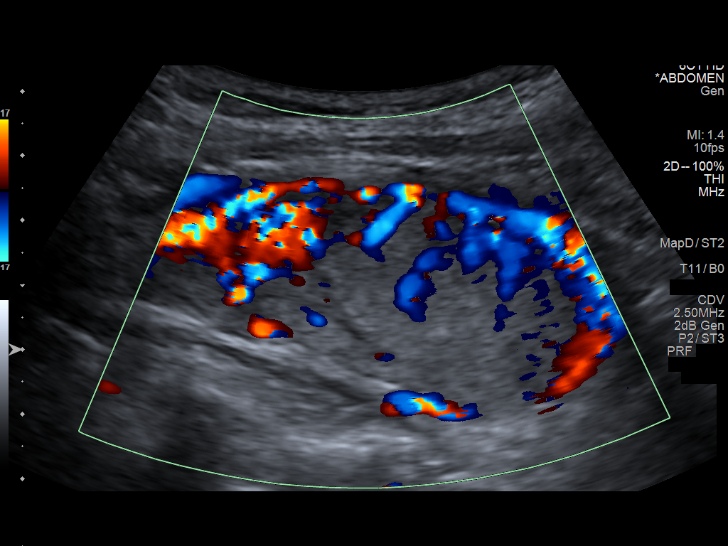
[im 17/41]
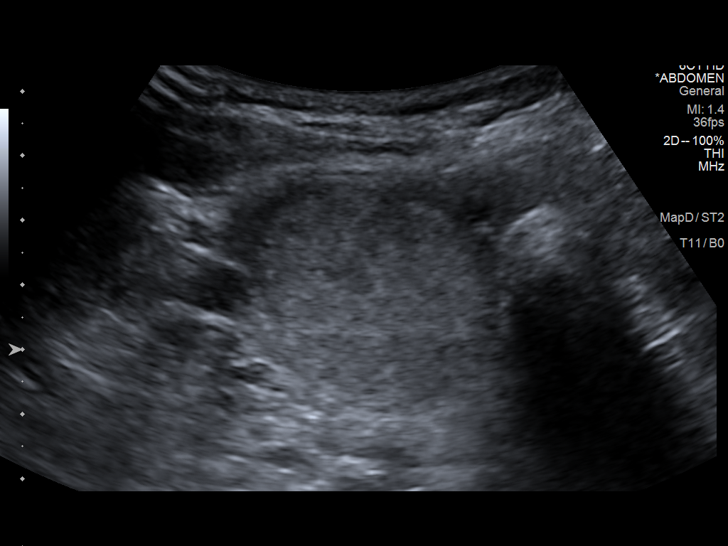
[im 21/41]
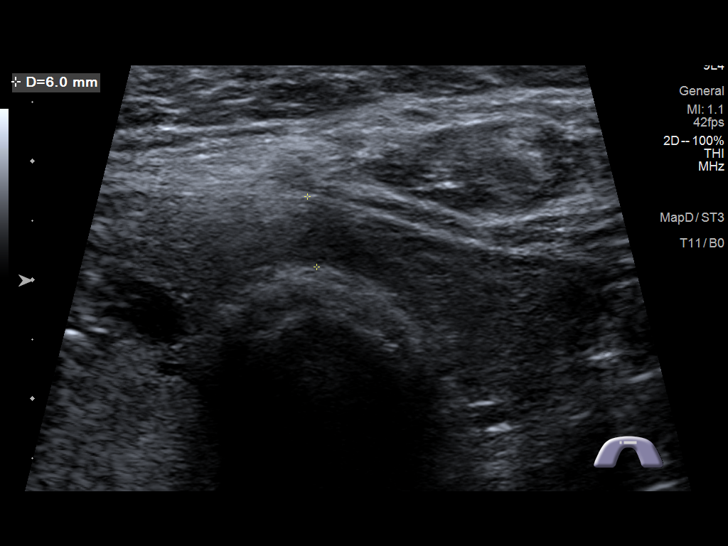
[im 24/41]
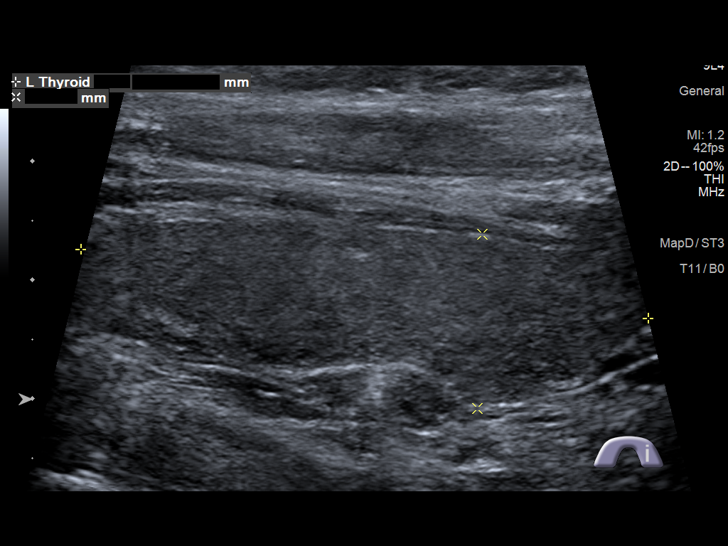
[im 27/41]
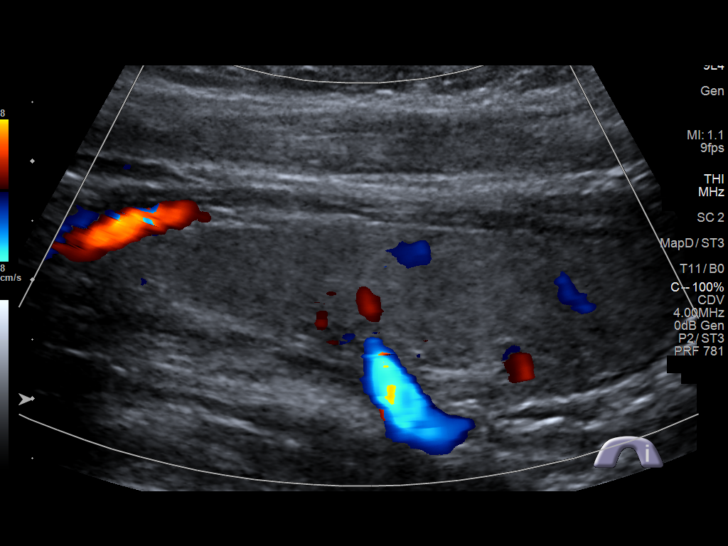
[im 31/41]
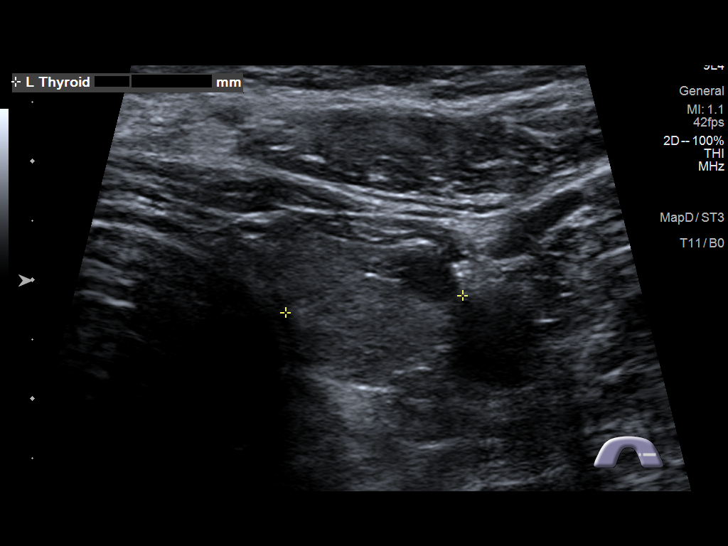
[im 34/41]
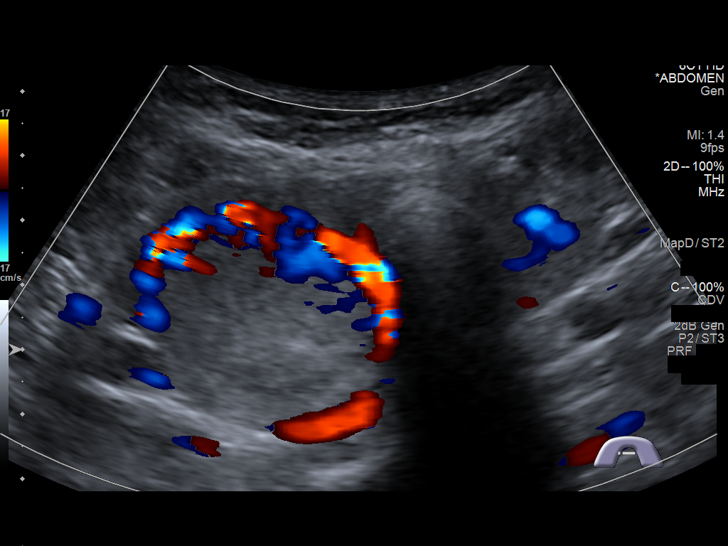
[im 37/41]
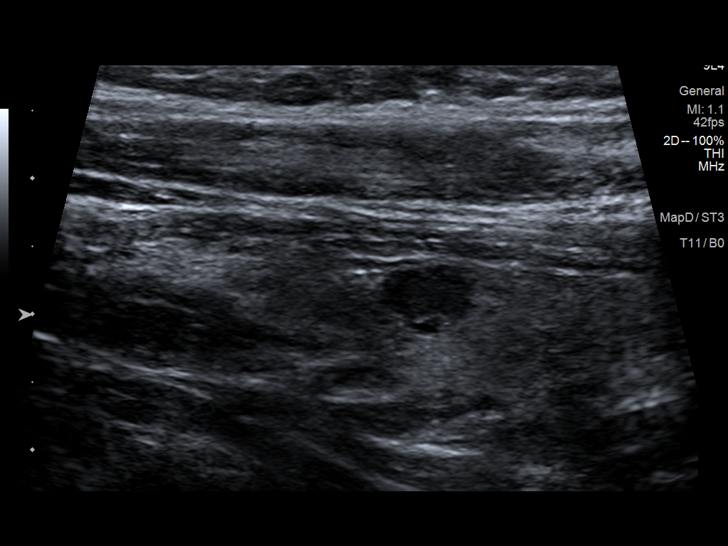
[im 41/41]
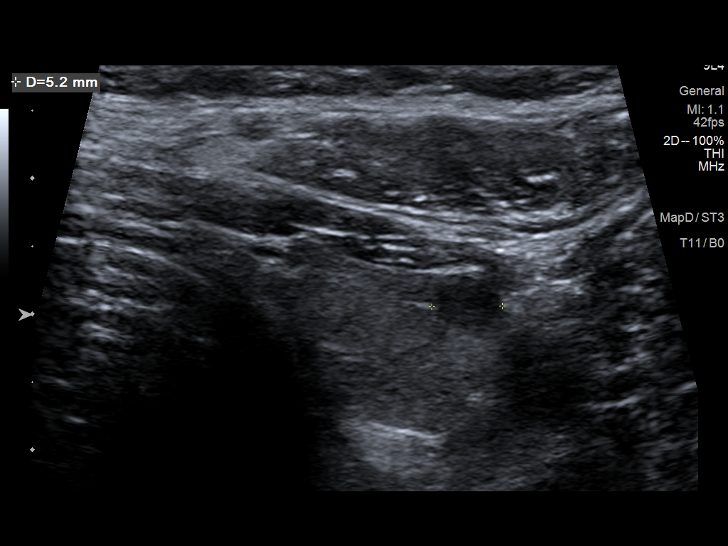

[13 of 25 positions shown; findings below may reference images not displayed]

FINDINGS: Parenchymal Echotexture: Mildly heterogenous

Isthmus: Normal in size measures 0.6 cm in diameter

Right lobe: Enlarged measuring 8.1 x 3.7 x 4.2 cm

Left lobe: Normal in size measuring 4.8 x 1.5 x 1.5 cm

_________________________________________________________

Estimated total number of nodules >/= 1 cm: 1

Number of spongiform nodules >/=  2 cm not described below (TR1): 0

Number of mixed cystic and solid nodules >/= 1.5 cm not described
below (TR2): 0

_________________________________________________________

Nodule # 1:

Location: Right; Mid

Maximum size: 5.3 cm; Other 2 dimensions: 3.8 x 3.2 cm

Composition: solid/almost completely solid (2)

Echogenicity: isoechoic (1)

Shape: not taller-than-wide (0)

Margins: ill-defined (0)

Echogenic foci: none (0)

ACR TI-RADS total points: 3.

ACR TI-RADS risk category: TR3 (3 points).

ACR TI-RADS recommendations:

**Given size (>/= 2.5 cm) and appearance, fine needle aspiration of
this mildly suspicious nodule should be considered based on TI-RADS
criteria.

_________________________________________________________

There is an approximately 0.7 cm minimally complex anechoic cyst
within the superior pole the left lobe of the thyroid (labeled 2),
which does not meet criteria to recommend percutaneous sampling or
continued dedicated follow-up.
IMPRESSION: Asymmetric enlargement of the right lobe of the thyroid due to an
approximately 5.3 cm right-sided thyroid nodule/mass which meets
imaging criteria to recommend percutaneous sampling as clinically
indicated.

The above is in keeping with the ACR TI-RADS recommendations - [HOSPITAL] 2211;[DATE].

## 2021-04-12 IMAGING — US US FNA BIOPSY THYROID 1ST LESION
1 series · 13 of 14 positions shown · non-contrast
Comparison: Thyroid ultrasound dated 12/18/2019

MEDICATIONS:
None

COMPLICATIONS:
None immediate.

INDICATION: Patient with history of incidental thyroid nodule noted on CT scan
and follow-up thyroid ultrasound on 12/18/2019 which revealed a
cm right mid nodule which meets criteria for biopsy. He presents
today for the procedure.

EXAM:
ULTRASOUND GUIDED FINE NEEDLE ASPIRATION BIOPSY OF RIGHT MID THYROID
NODULE
TECHNIQUE: Informed written consent was obtained from the patient after a
discussion of the risks, benefits and alternatives to treatment.
Questions regarding the procedure were encouraged and answered. A
timeout was performed prior to the initiation of the procedure.

[Series 1: us fna biopsy thyroid 1st lesion · 0.09mm/px · 14 acquisitions, 13 frames shown]
[im 1/14]
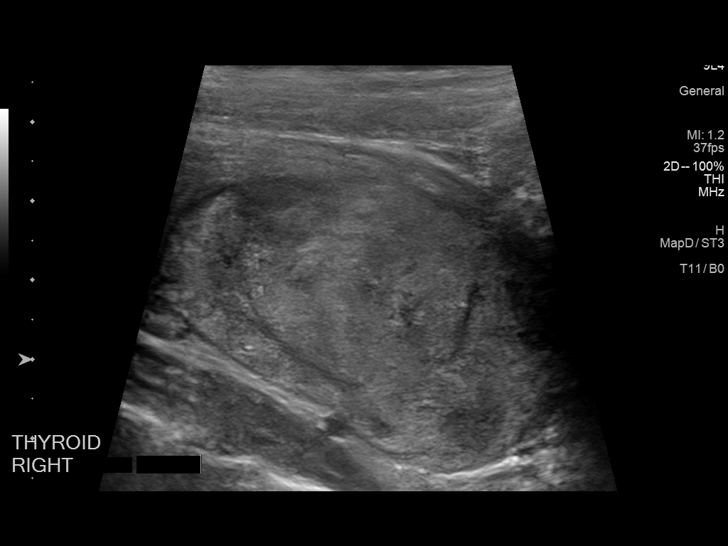
[im 2/14]
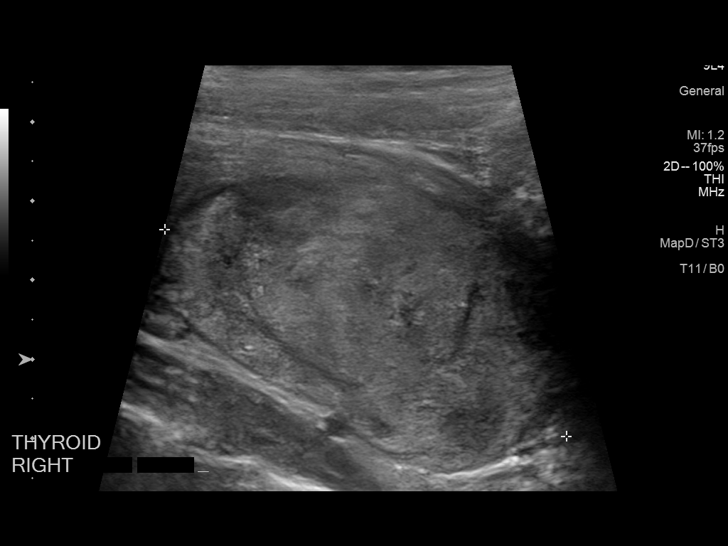
[im 3/14]
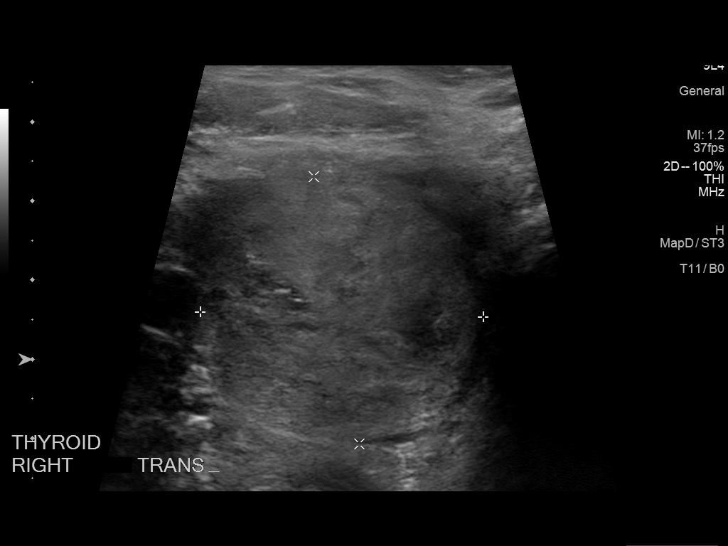
[im 4/14]
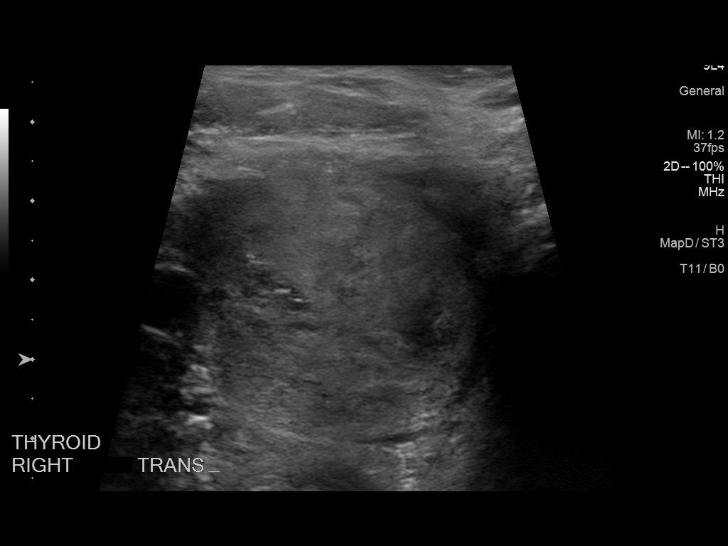
[im 5/14]
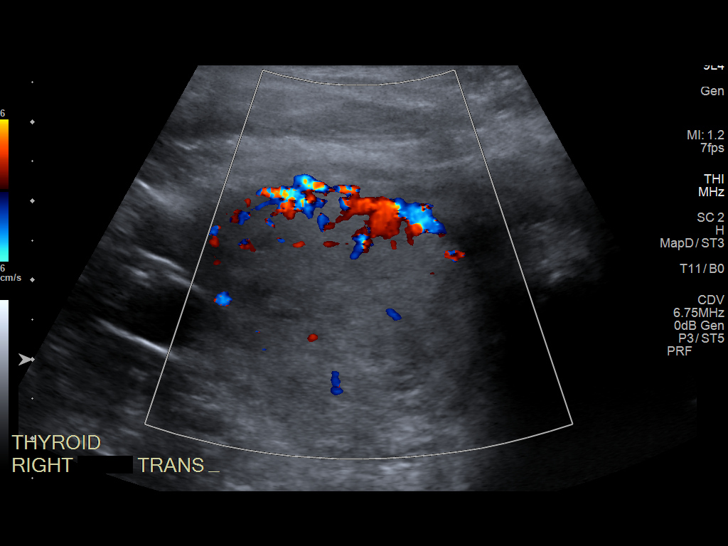
[im 6/14]
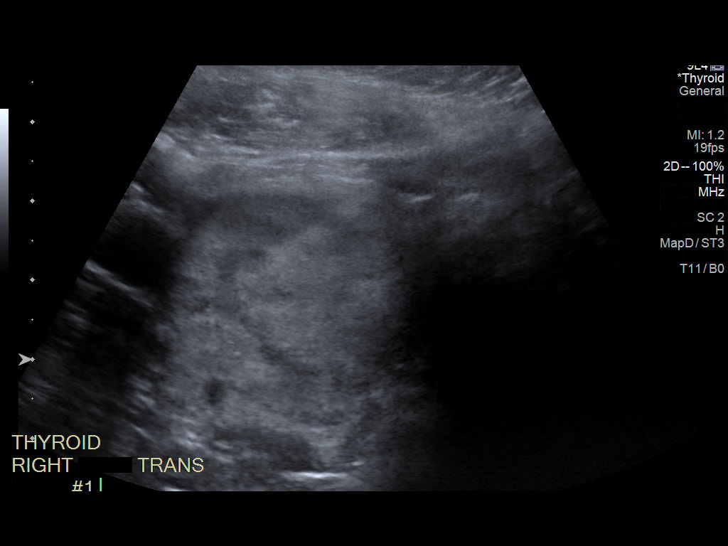
[im 8/14]
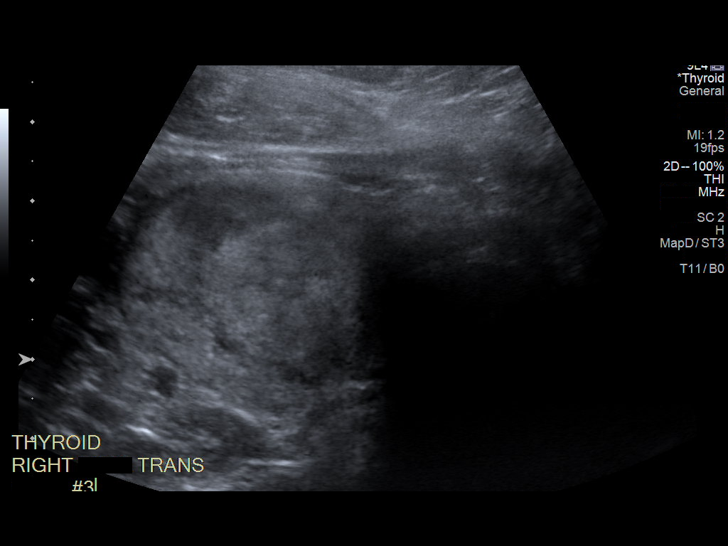
[im 9/14]
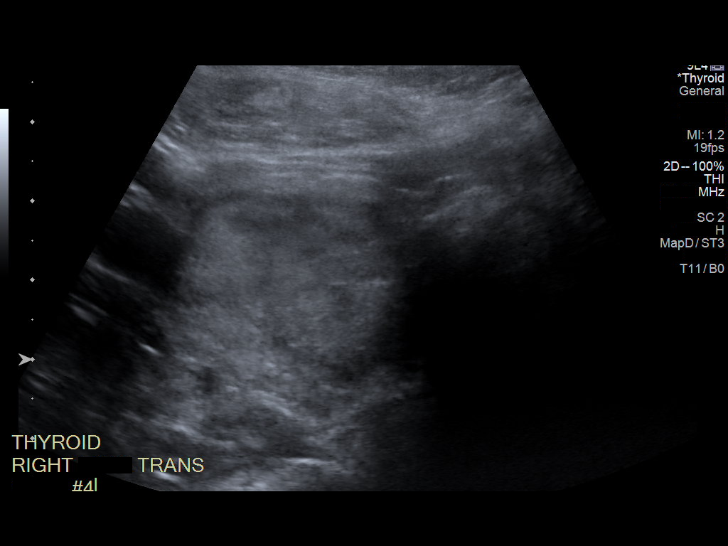
[im 10/14]
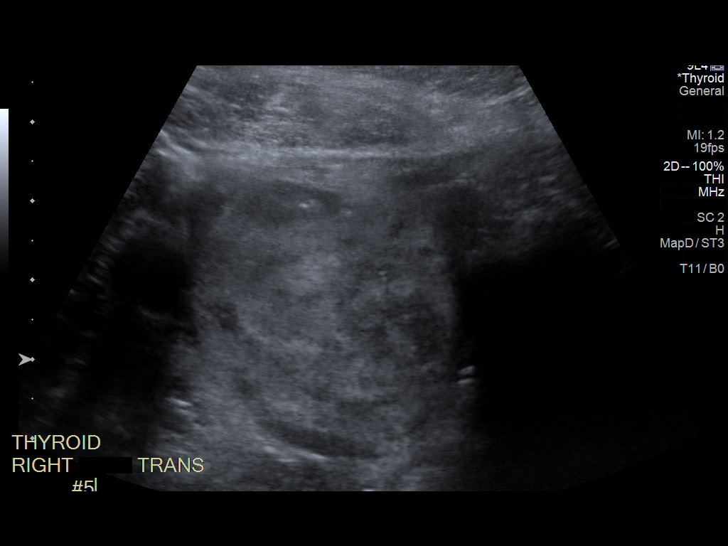
[im 11/14]
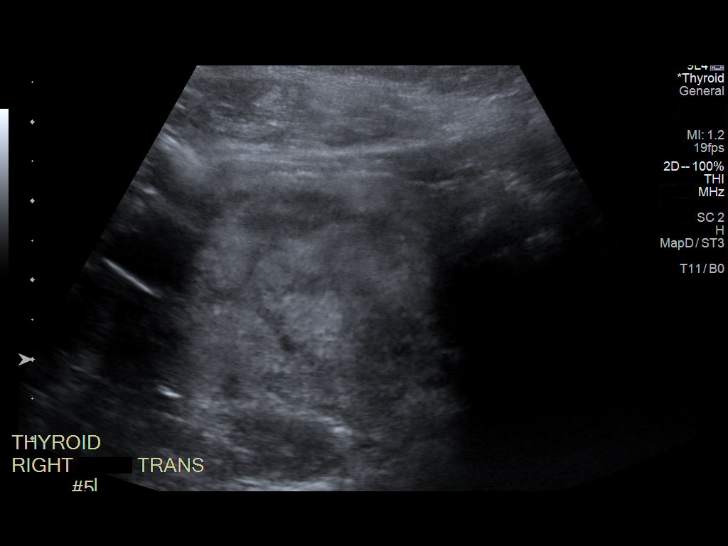
[im 12/14]
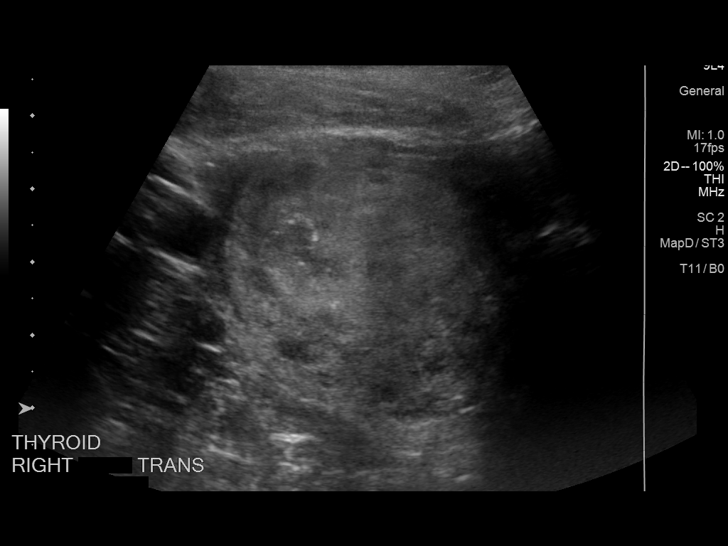
[im 13/14]
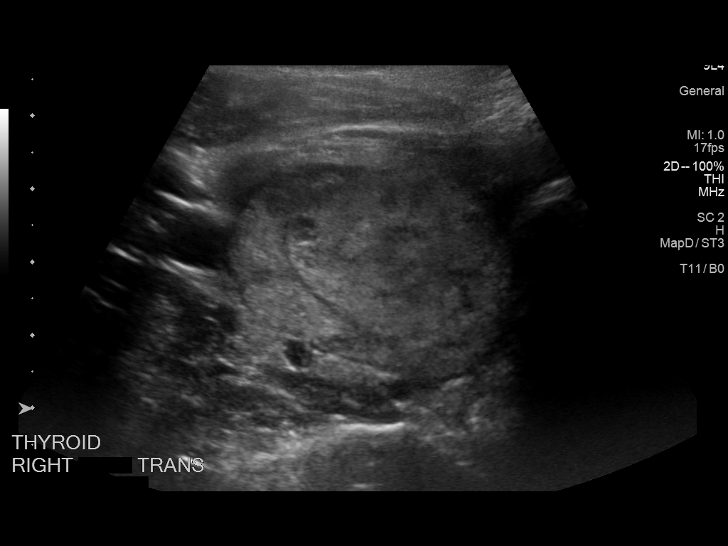
[im 14/14]
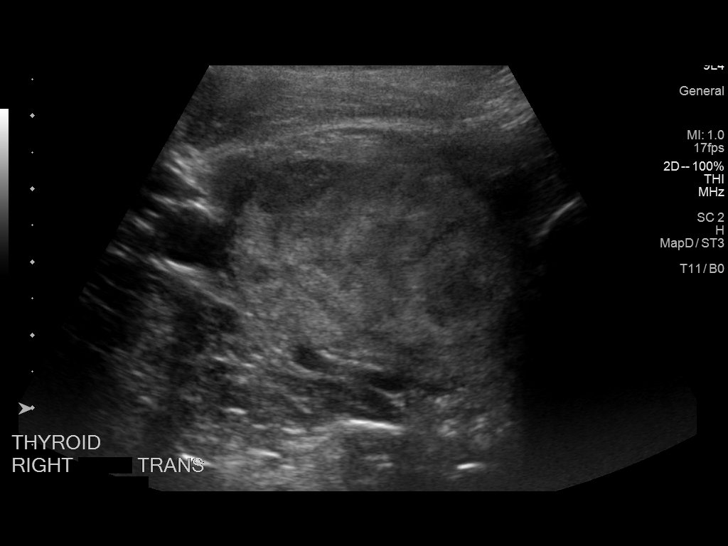

[13 of 14 positions shown; findings below may reference images not displayed]

Pre-procedural ultrasound scanning demonstrated unchanged size and
appearance of the indeterminate nodule within the right mid thyroid
lobe

The procedure was planned. The neck was prepped in the usual sterile
fashion, and a sterile drape was applied covering the operative
field. A timeout was performed prior to the initiation of the
procedure. Local anesthesia was provided with 1% lidocaine.

Under direct ultrasound guidance, 5 FNA biopsies were performed of
the right mid thyroid nodule with 25 gauge needles. Multiple
ultrasound images were saved for procedural documentation purposes.
The samples were prepared and submitted to pathology as well as for
Afirma testing.

Limited post procedural scanning was negative for hematoma or
additional complication. Dressings were placed. The patient
tolerated the above procedures procedure well without immediate
postprocedural complication.
FINDINGS: Nodule reference number based on prior diagnostic ultrasound: 1

Maximum size: 5.3 cm

Location: Right; Mid

ACR TI-RADS risk category: TR3 (3 points)

Reason for biopsy: meets ACR TI-RADS criteria

Ultrasound imaging confirms appropriate placement of the needles
within the thyroid nodule.
IMPRESSION: Technically successful ultrasound guided fine needle aspiration
biopsy of right mid thyroid nodule. Final pathology pending.
